# Patient Record
Sex: Female | Born: 1970 | Race: White | Hispanic: No | Marital: Married | Smoking: Never smoker
Health system: Southern US, Community
[De-identification: ages and names within clinical notes are randomized; demographics above are authoritative.]

## PROBLEM LIST (undated history)

## (undated) DIAGNOSIS — S82899A Other fracture of unspecified lower leg, initial encounter for closed fracture: Secondary | ICD-10-CM

## (undated) DIAGNOSIS — N200 Calculus of kidney: Secondary | ICD-10-CM

## (undated) DIAGNOSIS — T7840XA Allergy, unspecified, initial encounter: Secondary | ICD-10-CM

## (undated) DIAGNOSIS — N189 Chronic kidney disease, unspecified: Secondary | ICD-10-CM

## (undated) DIAGNOSIS — K449 Diaphragmatic hernia without obstruction or gangrene: Secondary | ICD-10-CM

## (undated) DIAGNOSIS — K589 Irritable bowel syndrome without diarrhea: Secondary | ICD-10-CM

## (undated) DIAGNOSIS — M1990 Unspecified osteoarthritis, unspecified site: Secondary | ICD-10-CM

## (undated) HISTORY — PX: BACK SURGERY: SHX140

## (undated) HISTORY — PX: WISDOM TOOTH EXTRACTION: SHX21

## (undated) HISTORY — DX: Chronic kidney disease, unspecified: N18.9

## (undated) HISTORY — DX: Other fracture of unspecified lower leg, initial encounter for closed fracture: S82.899A

## (undated) HISTORY — DX: Unspecified osteoarthritis, unspecified site: M19.90

## (undated) HISTORY — DX: Allergy, unspecified, initial encounter: T78.40XA

## (undated) HISTORY — DX: Calculus of kidney: N20.0

## (undated) HISTORY — DX: Diaphragmatic hernia without obstruction or gangrene: K44.9

## (undated) HISTORY — DX: Irritable bowel syndrome, unspecified: K58.9

---

## 1998-08-16 ENCOUNTER — Other Ambulatory Visit: Admission: RE | Admit: 1998-08-16 | Discharge: 1998-08-16 | Payer: Self-pay | Admitting: Obstetrics & Gynecology

## 1999-05-15 HISTORY — PX: CYSTOSCOPY/RETROGRADE/URETEROSCOPY/STONE EXTRACTION WITH BASKET: SHX5317

## 1999-05-29 ENCOUNTER — Ambulatory Visit (HOSPITAL_COMMUNITY): Admission: RE | Admit: 1999-05-29 | Discharge: 1999-05-29 | Payer: Self-pay | Admitting: Urology

## 1999-05-29 ENCOUNTER — Encounter: Payer: Self-pay | Admitting: Urology

## 1999-08-10 ENCOUNTER — Other Ambulatory Visit: Admission: RE | Admit: 1999-08-10 | Discharge: 1999-08-10 | Payer: Self-pay | Admitting: Obstetrics & Gynecology

## 2000-03-12 ENCOUNTER — Inpatient Hospital Stay (HOSPITAL_COMMUNITY): Admission: AD | Admit: 2000-03-12 | Discharge: 2000-03-12 | Payer: Self-pay | Admitting: Obstetrics & Gynecology

## 2000-03-14 ENCOUNTER — Encounter: Payer: Self-pay | Admitting: Obstetrics & Gynecology

## 2000-03-14 ENCOUNTER — Inpatient Hospital Stay (HOSPITAL_COMMUNITY): Admission: AD | Admit: 2000-03-14 | Discharge: 2000-03-14 | Payer: Self-pay | Admitting: Obstetrics & Gynecology

## 2000-03-15 ENCOUNTER — Inpatient Hospital Stay (HOSPITAL_COMMUNITY): Admission: AD | Admit: 2000-03-15 | Discharge: 2000-03-18 | Payer: Self-pay | Admitting: Obstetrics and Gynecology

## 2000-03-21 ENCOUNTER — Encounter: Admission: RE | Admit: 2000-03-21 | Discharge: 2000-06-19 | Payer: Self-pay | Admitting: Obstetrics & Gynecology

## 2000-04-11 ENCOUNTER — Other Ambulatory Visit: Admission: RE | Admit: 2000-04-11 | Discharge: 2000-04-11 | Payer: Self-pay | Admitting: Obstetrics & Gynecology

## 2001-07-07 ENCOUNTER — Other Ambulatory Visit: Admission: RE | Admit: 2001-07-07 | Discharge: 2001-07-07 | Payer: Self-pay | Admitting: Obstetrics & Gynecology

## 2002-07-03 ENCOUNTER — Inpatient Hospital Stay (HOSPITAL_COMMUNITY): Admission: AD | Admit: 2002-07-03 | Discharge: 2002-07-06 | Payer: Self-pay | Admitting: Obstetrics & Gynecology

## 2002-08-03 ENCOUNTER — Other Ambulatory Visit: Admission: RE | Admit: 2002-08-03 | Discharge: 2002-08-03 | Payer: Self-pay | Admitting: Obstetrics & Gynecology

## 2004-06-26 ENCOUNTER — Ambulatory Visit: Payer: Self-pay | Admitting: Family Medicine

## 2004-06-28 ENCOUNTER — Ambulatory Visit: Payer: Self-pay | Admitting: Family Medicine

## 2004-07-05 ENCOUNTER — Ambulatory Visit: Payer: Self-pay

## 2004-07-26 ENCOUNTER — Ambulatory Visit: Payer: Self-pay | Admitting: Family Medicine

## 2004-08-17 ENCOUNTER — Ambulatory Visit: Payer: Self-pay | Admitting: Gastroenterology

## 2004-08-24 ENCOUNTER — Ambulatory Visit: Payer: Self-pay | Admitting: Gastroenterology

## 2004-09-20 ENCOUNTER — Ambulatory Visit: Payer: Self-pay | Admitting: Gastroenterology

## 2004-10-26 ENCOUNTER — Ambulatory Visit: Payer: Self-pay | Admitting: Gastroenterology

## 2005-01-03 ENCOUNTER — Ambulatory Visit: Payer: Self-pay | Admitting: Family Medicine

## 2005-06-15 ENCOUNTER — Ambulatory Visit: Payer: Self-pay | Admitting: Family Medicine

## 2005-06-22 ENCOUNTER — Ambulatory Visit: Payer: Self-pay | Admitting: Gastroenterology

## 2005-06-27 ENCOUNTER — Ambulatory Visit: Payer: Self-pay | Admitting: Gastroenterology

## 2005-07-27 ENCOUNTER — Ambulatory Visit: Payer: Self-pay | Admitting: Gastroenterology

## 2005-08-31 ENCOUNTER — Encounter: Admission: RE | Admit: 2005-08-31 | Discharge: 2005-08-31 | Payer: Self-pay | Admitting: Gastroenterology

## 2005-09-21 ENCOUNTER — Ambulatory Visit: Payer: Self-pay | Admitting: Gastroenterology

## 2005-10-17 ENCOUNTER — Ambulatory Visit: Payer: Self-pay | Admitting: Gastroenterology

## 2005-10-22 ENCOUNTER — Ambulatory Visit: Payer: Self-pay | Admitting: Gastroenterology

## 2006-02-06 ENCOUNTER — Other Ambulatory Visit: Admission: RE | Admit: 2006-02-06 | Discharge: 2006-02-06 | Payer: Self-pay | Admitting: Obstetrics and Gynecology

## 2006-06-22 ENCOUNTER — Ambulatory Visit: Payer: Self-pay | Admitting: Family Medicine

## 2006-07-05 ENCOUNTER — Ambulatory Visit: Payer: Self-pay | Admitting: Family Medicine

## 2007-02-18 ENCOUNTER — Other Ambulatory Visit: Admission: RE | Admit: 2007-02-18 | Discharge: 2007-02-18 | Payer: Self-pay | Admitting: Obstetrics & Gynecology

## 2007-05-05 ENCOUNTER — Ambulatory Visit: Payer: Self-pay | Admitting: Internal Medicine

## 2008-01-13 LAB — CONVERTED CEMR LAB: Pap Smear: NORMAL

## 2008-02-05 ENCOUNTER — Ambulatory Visit: Payer: Self-pay | Admitting: *Deleted

## 2008-02-05 DIAGNOSIS — Z87442 Personal history of urinary calculi: Secondary | ICD-10-CM | POA: Insufficient documentation

## 2008-02-05 DIAGNOSIS — M1990 Unspecified osteoarthritis, unspecified site: Secondary | ICD-10-CM | POA: Insufficient documentation

## 2008-02-11 ENCOUNTER — Ambulatory Visit: Payer: Self-pay | Admitting: Internal Medicine

## 2008-02-24 ENCOUNTER — Other Ambulatory Visit: Admission: RE | Admit: 2008-02-24 | Discharge: 2008-02-24 | Payer: Self-pay | Admitting: Obstetrics and Gynecology

## 2010-06-15 NOTE — Letter (Signed)
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 [address]   [place], [place] [address]   Phone: [phone]  Fax: [phone]    February 05, 2008   Employee:  KENDEL PESNELL    To Whom It May Concern:   For Medical reasons, please excuse the above named employee from work for the following dates:  Start:    End:    If you need additional information, please feel free to contact our office.         Sincerely,    Darra Lis RMA

## 2010-06-15 NOTE — Letter (Signed)
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 [address]   [place], [place] [address]   Phone: [phone]  Fax: [phone]    February 05, 2008   Employee:  KATRENA STEHLIN    To Whom It May Concern:   For Medical reasons, please excuse the above named employee from work for the following dates:  Start:02/06/08    End:02/06/08    If you need additional information, please feel free to contact our office at [phone].         Sincerely,        Darra Lis RMA

## 2010-06-15 NOTE — Assessment & Plan Note (Signed)
Summary: cough--acute only--tl   Vital Signs:  Patient Profile:   40 Years Old Female Height:     62 inches Weight:      192.2 pounds Temp:     98.1 degrees F oral BP sitting:   122 / 78  (right arm)  Vitals Entered By: Doristine Devoid (February 11, 2008 1:57 PM)                 PCP:  [facility]  Chief Complaint:  dry cough since last week and feels chest congestion but nothing coming up .  History of Present Illness: since last OV-- fever gone, aches resolved chief complaint  today is cough, OTCs no help unable to sleep    Current Allergies: ! SULFA  Past Medical History:    Reviewed history from 02/05/2008 and no changes required:       Nephrolithiasis, hx of       Osteoarthritis     Review of Systems  ENT      Complains of postnasal drainage.  Resp      Denies shortness of breath, sputum productive, and wheezing.   Physical Exam  General:     alert and well-developed.   Ears:     R ear normal and L ear normal.   Nose:     slightly  congested Mouth:     pharynx pink and moist, no erythema, and no exudates.   Lungs:     normal respiratory effort, no intercostal retractions, and no accessory muscle use.  Few ronchi w/  cough    Impression & Recommendations:  Problem # 1:  BRONCHITIS-ACUTE (ICD-466.0)  Her updated medication list for this problem includes:    Zithromax Z-pak 250 Mg Tabs (Azithromycin) .Marland Kitchen... As directed    Guaifenesin-codeine 100-10 Mg/2ml Soln (Guaifenesin-codeine) .Marland Kitchen... 1 or 2 tsp by mouth three times a day as needed cough   Complete Medication List: 1)  Birth Control Pill  2)  Zyrtec Allergy 10 Mg Tabs (Cetirizine hcl) .Marland Kitchen.. 1 by mouth qd 3)  Mucinex  .... Prn 4)  Ibuprofen  .... Prn 5)  Paxil 20 Mg Tabs (Paroxetine hcl) .... Take 1 tablet by mouth once a day 6)  Zithromax Z-pak 250 Mg Tabs (Azithromycin) .... As directed 7)  Guaifenesin-codeine 100-10 Mg/31ml Soln (Guaifenesin-codeine) .Marland Kitchen.. 1 or 2 tsp by mouth three  times a day as needed cough   Patient Instructions: 1)  fluids-rest -tylenol 2)  cough: mucinex 3)  if cough severe, codeine as needed; will cause drowsiness 4)  call if no better in few days   Prescriptions: GUAIFENESIN-CODEINE 100-10 MG/5ML SOLN (GUAIFENESIN-CODEINE) 1 or 2 tsp by mouth three times a day as needed cough  #250cc x 0   Entered and Authorized by:   Nolon Rod. Nic Lampe MD   Signed by:   Nolon Rod. Khyron Garno MD on 02/11/2008   Method used:   Print then Give to Patient   RxID:   [id number] ZITHROMAX Z-PAK 250 MG TABS (AZITHROMYCIN) as directed  #1 x 0   Entered and Authorized by:   Nolon Rod. Foch Rosenwald MD   Signed by:   Nolon Rod. Alonnah Lampkins MD on 02/11/2008   Method used:   Electronically to        [facility] (retail)       [address]       [place], [place]  [address]       Ph:  [phone]       Fax: [phone]   RxID:   [id number]  ]

## 2010-06-15 NOTE — Assessment & Plan Note (Signed)
Summary: ACUTE ONLY;SCRATCHY THROAT AND TENDER IN SINUS AREA//ALJ   Vital Signs:  Patient Profile:   40 Years Old Female Weight:      196.38 pounds Temp:     97.1 degrees F oral Pulse rate:   68 / minute Pulse rhythm:   regular Resp:     15 per minute BP sitting:   118 / 80  (left arm) Cuff size:   large  Pt. in pain?   no  Vitals Entered By: Wendall Stade (May 05, 2007 4:33 PM)                  Chief Complaint:  scratchy throat for one week, sinus pain and pressure for one month, and URI symptoms.  History of Present Illness: sinus pain and pressure tender to touch for one month, scratchy throat for one week  Clammy for last two to three nights   URI Symptoms; Rx: Mucinex, Zyrtec      This is a 40 year old woman who presents with URI symptoms.  Cough dry @ night.  The patient reports nasal congestion, clear nasal discharge, sore throat, dry cough, and sick contacts, but denies purulent nasal discharge, productive cough, and earache.  Associated symptoms include dyspnea, use of an antipyretic, and response to antipyretic.  The patient denies fever, stiff neck, wheezing, rash, vomiting, and diarrhea.  The patient also reports headache and severe fatigue.  The patient denies itchy watery eyes, itchy throat, sneezing, seasonal symptoms, response to antihistamine, and muscle aches.  Risk factors for Strep sinusitis include double sickening.  The patient denies the following risk factors for Strep sinusitis: poor response to decongestant, tooth pain, Strep exposure, and tender adenopathy.    Current Allergies (reviewed today): ! SULFA     Review of Systems  ENT      Complains of postnasal drainage.      Bilat facial pain; no purulence   Physical Exam  General:     Well-developed,well-nourished,in no acute distress; alert,appropriate and cooperative throughout examination Eyes:     No corneal or conjunctival inflammation noted. EOMI. Perrla. Vision grossly normal.  Ears:     External ear exam shows no significant lesions or deformities.  Otoscopic examination reveals clear canals, tympanic membranes are intact bilaterally without bulging, retraction, inflammation or discharge. Hearing is grossly normal bilaterally. Nose:     External nasal examination shows no deformity or inflammation. Nasal mucosa are pink and moist without lesions or exudates. Mouth:     Mild ereythema Neck:     No deformities, masses, or tenderness noted. Lungs:     Normal respiratory effort, chest expands symmetrically. Lungs are clear to auscultation, no crackles or wheezes. Cervical Nodes:     No lymphadenopathy noted Axillary Nodes:     No palpable lymphadenopathy    Impression & Recommendations:  Problem # 1:  URI (ICD-465.9)  Her updated medication list for this problem includes:    Zyrtec Allergy 10 Mg Tabs (Cetirizine hcl) .Marland Kitchen... 1 by mouth qd    Promethazine-codeine 6.25-10 Mg/33ml Syrp (Promethazine-codeine) .Marland Kitchen... 1 tsp q 4 hrs as needed   Complete Medication List: 1)  Birth Control Pill  2)  Zyrtec Allergy 10 Mg Tabs (Cetirizine hcl) .Marland Kitchen.. 1 by mouth qd 3)  Mucinex  .... Prn 4)  Ibuprofen  .... Prn 5)  Clarithromycin 500 Mg Tb24 (Clarithromycin) .... 2 once daily with a meal 6)  Promethazine-codeine 6.25-10 Mg/55ml Syrp (Promethazine-codeine) .Marland Kitchen.. 1 tsp q 4 hrs as needed  Patient Instructions: 1)  Drink as much fluid as you can tolerate for the next few days. Neti pot once daily as needed for nasal congestion.    Prescriptions: PROMETHAZINE-CODEINE 6.25-10 MG/5ML  SYRP (PROMETHAZINE-CODEINE) 1 tsp q 4 hrs as needed  #240cc x 0   Entered and Authorized by:   Marga Melnick MD   Signed by:   Marga Melnick MD on 05/05/2007   Method used:   Print then Give to Patient   RxID:   [id number] CLARITHROMYCIN 500 MG  TB24 (CLARITHROMYCIN) 2 once daily with a meal  #20 x 0   Entered and Authorized by:   Marga Melnick MD   Signed by:   Marga Melnick MD  on 05/05/2007   Method used:   Print then Give to Patient   RxID:   [id number]  ]

## 2010-06-15 NOTE — Assessment & Plan Note (Signed)
Summary: FEELING SICK   Vital Signs:  Patient Profile:   40 Years Old Female Height:     62 inches Weight:      195 pounds BMI:     35.79 O2 treatment:    Room Air Temp:     100.0 degrees F oral Pulse rate:   100 / minute Pulse rhythm:   regular Resp:     16 per minute BP sitting:   134 / 80  (right arm) Cuff size:   regular  Vitals Entered By: Darra Lis RMA (February 05, 2008 10:44 AM)                 Visit Type:  acute PCP:  [facility]  Chief Complaint:  body aches, fever, and scratchy throat - URI symptoms.  History of Present Illness: Patient is usually seen at another [facility].  They are here for an acute visit only.  Patient had a scratchy throat and PND for 2 days, but she attributed it to seasonal allergies. Then last night, the patient started with sore throat and achy and feverish, but no temp taken.  The patient reports nasal congestion, yellow nasal discharge, sore throat, productive cough, and sick contacts, but denies earache.  Associated symptoms include fever - to touch, use of an antipyretic, and some response to antipyretic.  The patient denies stiff neck, dyspnea, wheezing, rash, vomiting, and diarrhea.  The patient also reports headache, muscle aches, and some fatigue.  The patient denies any significant itchy watery eyes, itchy throat, sneezing.  The patient denies unilateral facial pain, unilateral nasal discharge, double sickening, tooth pain, Strep exposure, and tender adenopathy.      Updated Prior Medication List: * BIRTH CONTROL PILL  ZYRTEC ALLERGY 10 MG  TABS (CETIRIZINE HCL) 1 by mouth qd * MUCINEX prn * IBUPROFEN prn PAXIL 20 MG TABS (PAROXETINE HCL) Take 1 tablet by mouth once a day  Current Allergies (reviewed today): ! SULFA  Past Medical History:    Nephrolithiasis, hx of    Osteoarthritis   Family History:    Family History Hypertension  Social History:    Occupation: Administrator   Risk Factors:   Tobacco use:  never Passive smoke exposure:  no Drug use:  no HIV high-risk behavior:  yes Alcohol use:  yes Exercise:  no Seatbelt use:  100 %  Family History Risk Factors:    Family History of MI in females < 39 years old:  no    Family History of MI in males < 64 years old:  no  PAP Smear History:     Date of Last PAP Smear:  01/13/2008    Results:  Normal   Mammogram History:     Date of Last Mammogram:  09/09/2004    Results:  Normal Bilateral    Review of Systems       see HPI   Physical Exam  General:     Well-developed,well-nourished, well-hydrated, in no acute distress, but tired/ill appearing, non-toxic Eyes:     No conjunctival inflammation or injection noted. EOMI. PERRLA. Vision grossly normal. Ears:     External ear exam shows no significant lesions or deformities.  Otoscopic examination reveals clear canals, tympanic membranes are intact bilaterally without bulging, retraction, inflammation or discharge. Hearing is grossly normal bilaterally. Nose:     External nasal examination shows no deformity or inflammation. Nasal mucosa are pink and moist without lesions or exudates. Mouth:     Oral mucosa and  oropharynx without lesions or exudates.  some postnasal drip and minimal posterior pharynx erythema Neck:     supple, full ROM, no LAN.   Lungs:     Normal respiratory effort, chest expands symmetrically. Lungs are clear to auscultation, no crackles, rales  or wheezes. normal breath sounds and good air flow.   Heart:     Normal rate and regular rhythm. S1 and S2 normal without gallop, murmur, click, rub or other extra sounds. Abdomen:     Bowel sounds positive,abdomen soft and non-tender without masses, organomegaly, .no distention. no obvious hernia noted.   Msk:     No gross deformity noted of cervical,  thoracic or lumbar spine.  normal ROM and no muscle atrophy noted.   Extremities:     No clubbing, cyanosis, edema, or deformity noted with grossly  normal full range of motion of all joints.   Neurologic:     alert & oriented X3 and gait normal.  no deficit in strength noted, no balance problems noted, neuro is grossly intact Skin:     Intact without suspicious lesions or rashes Psych:     Cognition and judgment appear intact. Alert and cooperative, normally interactive     Impression & Recommendations:  Problem # 1:  URI (ICD-465.9) Get plenty of rest, drink lots of clear liquids, and use Tylenol or Ibuprofen for fever and comfort.  Use warm moist compresses if needed on sinuses, and over the counter cold/flu medications ( only as directed). Call if no improvement in 5-7 days, sooner if increasing pain, fever, or new symptoms.    Complete Medication List: 1)  Birth Control Pill  2)  Zyrtec Allergy 10 Mg Tabs (Cetirizine hcl) .Marland Kitchen.. 1 by mouth qd 3)  Mucinex  .... Prn 4)  Ibuprofen  .... Prn 5)  Paxil 20 Mg Tabs (Paroxetine hcl) .... Take 1 tablet by mouth once a day   Patient Instructions: 1)  Get plenty of rest, drink lots of clear liquids, and use Tylenol or Ibuprofen for fever and comfort.  Use warm moist compresses if needed on sinuses, and over the counter cold/flu medications ( only as directed). Call if no improvement in 5-7 days, sooner if increasing pain, fever, or new symptoms.   ]

## 2010-09-29 NOTE — Op Note (Signed)
[facility]  Patient:    Dorothy Armstrong, Dorothy Armstrong                       MRN: [id number] Proc. Date: 03/15/00 Adm. Date:  [id number] Disc. Date: [id number] Attending:  Genia Del                           Operative Report  PREOPERATIVE DIAGNOSES:       Breech presentation, early labor, term gestation.  PROCEDURE:                    Primary cesarean section.  POSTOPERATIVE DIAGNOSES:      Homero Fellers breech presentation, early labor, term gestation.  ANESTHESIA:                   Spinal.  SURGEON:                      Sheronette A. Cherly Hensen, M.D.  INDICATIONS:                  This is a 40 year old gravida 1, para 0 female at term with a known breech presentation who was scheduled for a primary cesarean section on March 16, 2000 but who presented with complaints of regular contractions x 1 hour and a finding of a cervix that was 4-5 cm dilated with a confirmed breech presentation by ultrasound.  Patient is now being taken to the operating room for a primary cesarean section.  She had had an ultrasound to evaluate for possible external cephalic version, but had oligohydramnios which negated the ability to perform the version.  Risks and benefits of the procedure were reviewed with the patient and her husband. Consent was signed.  The patient was transferred to the operating room.  PROCEDURE:                    Under adequate spinal anesthesia the patient was placed in the supine position with a left lateral tilt.  She was sterilely prepped and draped in the usual fashion and an indwelling Foley catheter was placed.  A Pfannenstiel skin incision was made, carried down to the rectus fascia.  Rectus fascia was incised in the midline, extended bilaterally.  The rectus fascia was then bluntly and with sharp dissection dissected off the rectus muscle in a superior and inferior fashion.  The rectus muscle was split in the midline using sharp dissection.  The  parietal peritoneum was entered bluntly and extended superiorly and inferiorly.  The vesicouterine peritoneum was opened.  The bladder was then bluntly dissected off the lower uterine segment and displaced from the operative field using a bladder retractor.  A curvilinear low transverse uterine incision was then made and extended bilaterally using bandage scissors.  The fetus was found to be in a frank breech position.  Initial delivery was that of the lower extremity and resultant delivery using the usual breech maneuver was then accomplished.  A live female infant was the result of the delivery.  The infant was bulb suctioned and the abdomen cord was clamped, cut.  The baby was transferred to the awaiting pediatricians who subsequently assigned Apgars of 8 and 9 at one and five minutes.  The weight of the baby was 8 pounds 2 ounces.  The placenta was spontaneous, intact.  Uterine cavity was explored.  No intracavitary lesion was noted.  The  uterine cavity was cleaned of debris.  Normal tubes and ovaries are noted bilaterally.  The uterine incision was closed in two layers. The first layer was a running locked stitch of 0 Monocryl.  Second layer was embrocating using 0 Monocryl suture.  The uterus was returned to the abdomen. The abdomen was irrigated.  Paracolic gutters were cleaned of debris. Specimen labeled "placenta" was now sent to pathology.  The vesicouterine peritoneum and parietal peritoneum were now closed.  Inspection of the uterine incision showed good hemostasis.  The rectus fascia was closed with 0 Vicryl x 2.  The subcutaneous area was irrigated, small bleeders cauterized.  The subcuticular areas injected with 1/4% Marcaine.  The skin was approximated using Ethicon staples.  Estimated blood loss was 700 cc.  Intraoperative fluid was 1900 cc Crystalloid.  Urine output was 400 cc clear yellow urine. Complications were none.  Sponge and instrument counts x 2 were correct.   The patient tolerated the procedure well and was transferred to the recovery room in stable condition. DD:  03/15/00 TD:  03/17/00 Job: [id number]

## 2010-09-29 NOTE — Discharge Summary (Signed)
   NAMEANDRE, Dorothy Armstrong                          ACCOUNT NO.:  [id number]   MEDICAL RECORD NO.:  [id number]                   PATIENT TYPE:  INP   LOCATION:  9116                                 FACILITY:  [facility]   PHYSICIAN:  Genia Del, M.D.             DATE OF BIRTH:  07-16-1970   DATE OF ADMISSION:  07/03/2002  DATE OF DISCHARGE:  07/06/2002                                 DISCHARGE SUMMARY   ADMISSION DIAGNOSES:  1. 39+ weeks gestation.  2. Previous Cesarean section.  3. Declined V-back.   DISCHARGE DIAGNOSES:  1. 39+ weeks gestation.  2. Previous Cesarean section.  3. Declined V-back.  4. Birth of a baby girl on July 03, 2002 by Cesarean section.   INTERVENTION:  On July 03, 2002 repeat low transverse Cesarean section  under spinal anesthesia.   HOSPITAL COURSE:  The patient was admitted on July 03, 2002. She is a  Gravida II, Para I at 39+ weeks gestation. She had a Cesarean section for  breech presentation in November of 2001 and declined a V-back. Her pregnancy  course was normal. Group B strep was negative. Repeat low transverse  Cesarean section was done under spinal anesthesia. She had a baby girl at  8:07 a.m. There was one nuchal cord. Apgar's were 8 and 9. Placenta was  spontaneous and complete. Hysterectomy was closed in two planes. Estimated  blood loss was 600 cc's and no complications occurred. The postoperative  evolution was uneventful. She remained afebrile and hemodynamically stable.  Her hemoglobin on postoperative day one was 10.6 with a hematocrit of 30.3.   DISPOSITION:  She was discharged home on postoperative day three in stable  status.   DISCHARGE MEDICATIONS:  Postoperative advise was given. She was prescribed  Motrin and Tylox as needed as well as Chromogen.   FOLLOW UP:  She will follow-up for postoperative evaluation at [facility]  in four weeks.                                               Genia Del,  M.D.    ML/MEDQ  D:  07/24/2002  T:  07/25/2002  Job:  [id number]

## 2010-09-29 NOTE — Op Note (Signed)
NAMECHRISTEAN, Dorothy Armstrong                          ACCOUNT NO.:  [id number]   MEDICAL RECORD NO.:  [id number]                   PATIENT TYPE:  INP   LOCATION:  9198                                 FACILITY:  [facility]   PHYSICIAN:  Genia Del, M.D.             DATE OF BIRTH:  07/24/1970   DATE OF PROCEDURE:  07/03/2002  DATE OF DISCHARGE:                                 OPERATIVE REPORT   PREOPERATIVE DIAGNOSES:  1. A 39+ week gestation.  2. Previous cesarean section.  3. Declined vaginal birth after cesarean.   POSTOPERATIVE DIAGNOSES:  1. A 39+ week gestation.  2. Previous cesarean section.  3. Declined vaginal birth after cesarean.   PROCEDURE:  Repeat low transverse cesarean section.   SURGEON:  Genia Del, M.D.   ASSISTANT:  Gerri Spore B. Earlene Plater, M.D.   ANESTHESIOLOGIST:  Quillian Quince, M.D.   PROCEDURE:  Under spinal anesthesia the patient is in 15 degree left  decubitus position.  She is prepped with Hibiclens in the abdominal,  suprapubic, vulvar, and vaginal areas.  The bladder catheter is inserted and  the patient is draped as usual.  We infiltrate the subcutaneous tissue with  Marcaine 0.25 plain 10 mL at the site of the previous scar.  We then make a  Pfannenstiel incision at that level with the scalpel.  We open the adipose  tissue with electrocautery.  We then open the aponeurosis transversely with  the Mayo scissors.  The aponeurosis is separated from the recti muscles on  the midline.  We then open the parietoperitoneum longitudinally with the  Metzenbaum scissors.  The lower uterine segment is opened transversely at  the level of the visceroperitoneum with the Metzenbaum scissors and the  bladder is declined downward.  A low transverse hysterotomy is done with the  scalpel and prolongation is achieved with scissors.  We rupture the  membranes.  The amniotic fluid is clear.  The baby is in cephalic  presentation.  Birth of a baby girl at 8:07 a.m.  One  loose nuchal cord is  present.  The baby is suctioned with a bulb after delivering the head.  The  cord is clamped and cut.  The baby is given to the neonatal team.  Apgars  are 8 and 9.  The cord blood is taken.  The placenta is evacuated  spontaneously.  It is complete with three vessels.  We then do a uterine  revision.  The uterus contracts well with IV Pitocin.  We close the  hysterotomy after exteriorizing the uterus with a Vicryl 0 in a running  locked suture.  A second plain in a mattress fashion is done with Vicryl 0.  One X stitch is applied on the right angle to complete hemostasis.  The  tubes and ovaries are normal bilaterally.  The uterus is put back in place.  The hysterotomy is verified.  Hemostasis is adequate.  We irrigate and  suction the abdominopelvic cavity.  We verify hemostasis at the level of the  recti muscles and it is completed with electrocautery.  We close the  aponeurosis with two half running sutures of Vicryl 0.  We then verify  hemostasis at  the adipose tissue.  It is completed with electrocautery.  Count of  instruments and sponges is complete x2.  We then reapproximate the skin with  staples and a dry dressing is applied.  The estimated blood loss was 600 mL.  No complications occurred and the patient was transferred to recovery room  in good status.                                               Genia Del, M.D.    ML/MEDQ  D:  07/03/2002  T:  07/03/2002  Job:  [id number]

## 2010-09-29 NOTE — Discharge Summary (Signed)
[facility]  Patient:    Dorothy Armstrong, Dorothy Armstrong                       MRN: [id number] Adm. Date:  [id number] Disc. Date: [id number] Attending:  Maxie Better                           Discharge Summary  ADMISSION DIAGNOSES:          1.  Early labor.                               2.  Breech presentation.                               3.  Term gestation.  DISCHARGE DIAGNOSES:          1.  Frank breech presentation.                               2.  Term gestation, delivered early labor.                               3.  Iron deficiency anemia.  HISTORY OF PRESENT ILLNESS:   This is a 40 year old gravida 1, para 0 female with known breech presentation who was scheduled for a cesarean section on March 16, 2000 but presented with labor and a cervical change and still confirmed breech presentation who is now being admitted for primary cesarean section. She had intact membranes.  HOSPITAL COURSE:              The patient was admitted. She was taken to the operating room where under spinal anesthesia she underwent a primary cesarean section. This resulted in a delivery of a live female infant from the frank breech presentation. Weight of the baby was 8 pounds 2 ounces, length was 21 inches, Apgars of 8 and 9. The tubes and ovaries were noted to be normal. No intercavitary lesions were noted. The patients postoperative course was unremarkable. She was tolerating a regular diet, passing flatus, and by postop day #3 showed no evidence of an infection and was deemed well to be discharged home. Her CBC on postop day #1 showed a hematocrit of 28.7, hemoglobin of 10.0, platelets 187,000, white count of 11.2. RPR was nonreactive. The patients blood type was O positive, her rubella was nonimmune, hepatitis B surface antigen was negative. The patient received rubella vaccination prior to discharge.  DISPOSITION:                   Home.  CONDITION:                     Stable.  DISCHARGE FOLLOW-UP:          [facility] in four to six weeks.  DISCHARGE INSTRUCTIONS:       Call for temperature greater than or equal to 100.4. Nothing per vagina for four to six weeks. No heavy lifting or driving for two weeks. Call if increased incisional pain, redness, or drainage. Call if severe abdominal pain and/or soaking a regular pad every hour or more frequently.  DISCHARGE MEDICATIONS:  1.  Tylox #30 one p.o. q.6h. p.r.n. pain.                               2.  Over-the-counter iron one p.o. q.d.                               3.  Prenatal vitamins one p.o. q.d. DD:  04/07/00 TD:  04/07/00 Job: [id number]

## 2011-08-23 ENCOUNTER — Other Ambulatory Visit: Payer: Self-pay | Admitting: Dermatology

## 2011-10-09 ENCOUNTER — Other Ambulatory Visit: Payer: Self-pay | Admitting: Dermatology

## 2012-04-24 ENCOUNTER — Other Ambulatory Visit: Payer: Self-pay | Admitting: Dermatology

## 2013-06-09 ENCOUNTER — Ambulatory Visit: Payer: Self-pay | Admitting: Obstetrics & Gynecology

## 2013-06-09 ENCOUNTER — Ambulatory Visit: Payer: Self-pay | Admitting: Obstetrics and Gynecology

## 2013-06-15 ENCOUNTER — Encounter: Payer: Self-pay | Admitting: Obstetrics & Gynecology

## 2013-06-15 ENCOUNTER — Ambulatory Visit (INDEPENDENT_AMBULATORY_CARE_PROVIDER_SITE_OTHER): Payer: Managed Care, Other (non HMO) | Admitting: Obstetrics & Gynecology

## 2013-06-15 VITALS — BP 104/60 | HR 64 | Resp 16 | Ht 62.25 in | Wt 208.5 lb

## 2013-06-15 DIAGNOSIS — Z0000 Encounter for general adult medical examination without abnormal findings: Secondary | ICD-10-CM

## 2013-06-15 DIAGNOSIS — Z01419 Encounter for gynecological examination (general) (routine) without abnormal findings: Secondary | ICD-10-CM

## 2013-06-15 LAB — BASIC METABOLIC PANEL
BUN: 14 mg/dL (ref 6–23)
CO2: 26 mEq/L (ref 19–32)
Calcium: 9.1 mg/dL (ref 8.4–10.5)
Chloride: 102 mEq/L (ref 96–112)
Creat: 0.63 mg/dL (ref 0.50–1.10)
Glucose, Bld: 81 mg/dL (ref 70–99)
Potassium: 3.8 mEq/L (ref 3.5–5.3)
Sodium: 134 mEq/L — ABNORMAL LOW (ref 135–145)

## 2013-06-15 LAB — POCT URINALYSIS DIPSTICK
Bilirubin, UA: NEGATIVE
Glucose, UA: NEGATIVE
Ketones, UA: NEGATIVE
Nitrite, UA: NEGATIVE
Protein, UA: NEGATIVE
Urobilinogen, UA: NEGATIVE
pH, UA: 5

## 2013-06-15 LAB — LIPID PANEL
Cholesterol: 155 mg/dL (ref 0–200)
HDL: 49 mg/dL (ref >39)
LDL Cholesterol: 82 mg/dL (ref 0–99)
Total CHOL/HDL Ratio: 3.2 Ratio
Triglycerides: 121 mg/dL (ref <150)
VLDL: 24 mg/dL (ref 0–40)

## 2013-06-15 MED ORDER — PAROXETINE HCL 20 MG PO TABS: 20.0000 mg | ORAL_TABLET | Freq: Every day | ORAL | Status: DC

## 2013-06-15 NOTE — Progress Notes (Signed)
43 y.o. G2P2 MarriedCaucasianF here for annual exam.  Started weight watchers this year.  Really hopes to get some weight off.  Typically, cycles are between 4-6wks.  Normally cycles are 4-6 and first two to three are heavy.  Changes super plus tampons every 4-5 hours.  In January, Had two cycles.  This is the first time that occurred for her.  Was on OCPs in the past but would prefer not to be on them again.  Wants to discuss options.    Patient's last menstrual period was 05/23/2013.          Sexually active: yes  The current method of family planning is vasectomy.    Exercising: yes  walking and eliptical Smoker:  no  Health Maintenance: Pap:  06/03/12 WNL/negative HR HPV History of abnormal Pap:  Per patient-false positive MMG:  1/14 bilat screening, 11/19/12 right diag-6 month bilateral scheduled for 06/16/13 Colonoscopy:  none BMD:   none TDaP:  4/11 Screening Labs: today, Hb today: today, Urine today: WBC-trace, RBC-trace   reports that she has never smoked. She has never used smokeless tobacco. She reports that she drinks alcohol. She reports that she does not use illicit drugs.  Past Medical History  Diagnosis Date  . IBS (irritable bowel syndrome)   . Hiatal hernia   . Kidney stone   . Ankle fracture     Past Surgical History  Procedure Laterality Date  . Wisdom tooth extraction    . Cesarean section      x2  . Kidney stone surgery      Current Outpatient Prescriptions  Medication Sig Dispense Refill  . Clindamycin-Benzoyl Per, Refr, gel daily.      . Multiple Vitamins-Minerals (MULTIVITAMIN PO) Take by mouth daily.      Marland Kitchen PARoxetine (PAXIL) 20 MG tablet 20 mg daily.      Marland Kitchen tretinoin (RETIN-A) 0.025 % cream daily.       No current facility-administered medications for this visit.    Family History  Problem Relation Age of Onset  . Breast cancer Paternal Aunt   . Hypertension Father   . Hypertension Paternal Grandfather   . CVA Paternal Grandfather   . Colon  cancer Paternal Grandmother     ROS:  Pertinent items are noted in HPI.  Otherwise, a comprehensive ROS was negative.  Exam:   BP 104/60  Pulse 64  Resp 16  Ht 5' 2.25" (1.581 m)  Wt 208 lb 8 oz (94.575 kg)  BMI 37.84 kg/m2  LMP 05/23/2013  Weight change: -6lbs  Height: 5' 2.25" (158.1 cm)  Ht Readings from Last 3 Encounters:  06/15/13 5' 2.25" (1.581 m)  02/05/08 5\' 2"  (1.575 m)    General appearance: alert, cooperative and appears stated age Head: Normocephalic, without obvious abnormality, atraumatic Neck: no adenopathy, supple, symmetrical, trachea midline and thyroid normal to inspection and palpation Lungs: clear to auscultation bilaterally Breasts: normal appearance, no masses or tenderness Heart: regular rate and rhythm Abdomen: soft, non-tender; bowel sounds normal; no masses,  no organomegaly Extremities: extremities normal, atraumatic, no cyanosis or edema Skin: Skin color, texture, turgor normal. No rashes or lesions Lymph nodes: Cervical, supraclavicular, and axillary nodes normal. No abnormal inguinal nodes palpated Neurologic: Grossly normal   Pelvic: External genitalia:  no lesions              Urethra:  normal appearing urethra with no masses, tenderness or lesions  Bartholins and Skenes: normal                 Vagina: normal appearing vagina with normal color and discharge, no lesions              Cervix: no lesions              Pap taken: no Bimanual Exam:  Uterus:  normal size, contour, position, consistency, mobility, non-tender              Adnexa: normal adnexa and no mass, fullness, tenderness               Rectovaginal: Confirms               Anus:  normal sphincter tone, no lesions  A:  Well Woman with normal exam Menorrhagia Obesity, pt doing weight watchers  P:   Mammogram starting age 63 pap smear with neg HR HPV 1/14 Endometrial ablation and Mirena IUD discussed.  Information given. BMP and lipids done for health insurance  screening recommendations. return annually or prn  An After Visit Summary was printed and given to the patient.

## 2013-06-15 NOTE — Patient Instructions (Signed)

## 2013-07-08 ENCOUNTER — Encounter: Payer: Self-pay | Admitting: Obstetrics & Gynecology

## 2013-10-29 ENCOUNTER — Other Ambulatory Visit: Payer: Self-pay | Admitting: Dermatology

## 2014-03-15 ENCOUNTER — Encounter: Payer: Self-pay | Admitting: Obstetrics & Gynecology

## 2014-06-28 ENCOUNTER — Encounter: Payer: Self-pay | Admitting: Obstetrics & Gynecology

## 2014-06-28 ENCOUNTER — Ambulatory Visit (INDEPENDENT_AMBULATORY_CARE_PROVIDER_SITE_OTHER): Payer: Managed Care, Other (non HMO) | Admitting: Obstetrics & Gynecology

## 2014-06-28 VITALS — BP 112/68 | HR 76 | Resp 18 | Ht 62.5 in | Wt 215.0 lb

## 2014-06-28 DIAGNOSIS — Z124 Encounter for screening for malignant neoplasm of cervix: Secondary | ICD-10-CM

## 2014-06-28 DIAGNOSIS — Z01419 Encounter for gynecological examination (general) (routine) without abnormal findings: Secondary | ICD-10-CM

## 2014-06-28 DIAGNOSIS — Z0000 Encounter for general adult medical examination without abnormal findings: Secondary | ICD-10-CM

## 2014-06-28 DIAGNOSIS — N920 Excessive and frequent menstruation with regular cycle: Secondary | ICD-10-CM

## 2014-06-28 LAB — POCT URINALYSIS DIPSTICK
Bilirubin, UA: NEGATIVE
Glucose, UA: NEGATIVE
Ketones, UA: NEGATIVE
Leukocytes, UA: NEGATIVE
Nitrite, UA: NEGATIVE
Protein, UA: NEGATIVE
Urobilinogen, UA: NEGATIVE
pH, UA: 5

## 2014-06-28 LAB — COMPREHENSIVE METABOLIC PANEL
ALT: 13 U/L (ref 0–35)
AST: 15 U/L (ref 0–37)
Albumin: 4 g/dL (ref 3.5–5.2)
Alkaline Phosphatase: 85 U/L (ref 39–117)
BUN: 12 mg/dL (ref 6–23)
CO2: 25 mEq/L (ref 19–32)
Calcium: 9.6 mg/dL (ref 8.4–10.5)
Chloride: 103 mEq/L (ref 96–112)
Creat: 0.62 mg/dL (ref 0.50–1.10)
Glucose, Bld: 79 mg/dL (ref 70–99)
Potassium: 3.7 mEq/L (ref 3.5–5.3)
Sodium: 137 mEq/L (ref 135–145)
Total Bilirubin: 0.3 mg/dL (ref 0.2–1.2)
Total Protein: 7.1 g/dL (ref 6.0–8.3)

## 2014-06-28 LAB — LIPID PANEL
Cholesterol: 170 mg/dL (ref 0–200)
HDL: 56 mg/dL (ref >39)
LDL Cholesterol: 87 mg/dL (ref 0–99)
Total CHOL/HDL Ratio: 3 Ratio
Triglycerides: 134 mg/dL (ref <150)
VLDL: 27 mg/dL (ref 0–40)

## 2014-06-28 LAB — TSH: TSH: 1.413 u[IU]/mL (ref 0.350–4.500)

## 2014-06-28 MED ORDER — PAROXETINE HCL 20 MG PO TABS: 20.0000 mg | ORAL_TABLET | Freq: Every day | ORAL | Status: DC

## 2014-06-28 NOTE — Progress Notes (Signed)
44 y.o. G2P2 MarriedCaucasianF here for annual exam.  Doing well.  Reports cycles continue to regular.  Flow is heavier.  Both daughters are in middle school and she feels very emotional as well.  Pt is ready to consider options this year.  Flow is usually 5-6 days.  Flow can be longer than this but not usually.      Patient's last menstrual period was 06/22/2014.          Sexually active: Yes.    The current method of family planning is vasectomy.    Exercising: Yes.    Started 2 weeks ago Smoker:  no  Health Maintenance: Pap: 05/2012 Neg. HR HPV:neg History of abnormal Pap:  yes MMG: 12/2013 BIRADS3:Probably Benign  Colonoscopy:  None BMD:   None TDaP: 2010 per pt Screening Labs: here, Hb today: here, Urine today: RBC=Large (on Menses)   reports that she has never smoked. She has never used smokeless tobacco. She reports that she drinks alcohol. She reports that she does not use illicit drugs.  Past Medical History  Diagnosis Date  . IBS (irritable bowel syndrome)   . Hiatal hernia   . Kidney stone   . Ankle fracture     Past Surgical History  Procedure Laterality Date  . Wisdom tooth extraction    . Cesarean section  2001, 2004    x2  . Cystoscopy/retrograde/ureteroscopy/stone extraction with basket  2001    Current Outpatient Prescriptions  Medication Sig Dispense Refill  . Clindamycin-Benzoyl Per, Refr, gel daily.    . Multiple Vitamins-Minerals (MULTIVITAMIN PO) Take by mouth daily.    Marland Kitchen PARoxetine (PAXIL) 20 MG tablet Take 1 tablet (20 mg total) by mouth daily. 90 tablet 4  . tretinoin (RETIN-A) 0.025 % cream daily.     No current facility-administered medications for this visit.    Family History  Problem Relation Age of Onset  . Breast cancer Paternal Aunt   . Hypertension Father   . Hypertension Paternal Grandfather   . CVA Paternal Grandfather   . Colon cancer Paternal Grandmother     ROS:  Pertinent items are noted in HPI.  Otherwise, a comprehensive  ROS was negative.  Exam:   BP 112/68 mmHg  Pulse 76  Resp 18  Ht 5' 2.5" (1.588 m)  Wt 215 lb (97.523 kg)  BMI 38.67 kg/m2  LMP 06/22/2014  Weight change: +7#  Height: 5' 2.5" (158.8 cm)  Ht Readings from Last 3 Encounters:  06/28/14 5' 2.5" (1.588 m)  06/15/13 5' 2.25" (1.581 m)  02/05/08 5\' 2"  (1.575 m)    General appearance: alert, cooperative and appears stated age Head: Normocephalic, without obvious abnormality, atraumatic Neck: no adenopathy, supple, symmetrical, trachea midline and thyroid normal to inspection and palpation Lungs: clear to auscultation bilaterally Breasts: normal appearance, no masses or tenderness Heart: regular rate and rhythm Abdomen: soft, non-tender; bowel sounds normal; no masses,  no organomegaly Extremities: extremities normal, atraumatic, no cyanosis or edema Skin: Skin color, texture, turgor normal. No rashes or lesions Lymph nodes: Cervical, supraclavicular, and axillary nodes normal. No abnormal inguinal nodes palpated Neurologic: Grossly normal   Pelvic: External genitalia:  no lesions              Urethra:  normal appearing urethra with no masses, tenderness or lesions              Bartholins and Skenes: normal  Vagina: normal appearing vagina with normal color and discharge, no lesions              Cervix: no lesions              Pap taken: Yes.   Bimanual Exam:  Uterus:  normal size, contour, position, consistency, mobility, non-tender              Adnexa: normal adnexa and no mass, fullness, tenderness               Rectovaginal: Confirms               Anus:  normal sphincter tone, no lesions  Chaperone was present for exam.  A:  Well Woman with normal exam Menorrhagia  P: yearly MMG.  Had breast calcifications last year.  Knows needs follow up this month.  States she will call to schedule.   pap smear with neg HR HPV 1/14.  Pap only today. Paxil 20mg  daily.  #90/4RF CMP, TSH, Vit D, Lipids drawn  today Endometrial ablation and Mirena IUD discussed. Information given. return annually or prn

## 2014-06-29 LAB — VITAMIN D 25 HYDROXY (VIT D DEFICIENCY, FRACTURES): Vit D, 25-Hydroxy: 20 ng/mL — ABNORMAL LOW (ref 30–100)

## 2014-06-30 LAB — IPS PAP TEST WITH REFLEX TO HPV

## 2014-07-05 ENCOUNTER — Telehealth: Payer: Self-pay

## 2014-07-05 MED ORDER — VITAMIN D (ERGOCALCIFEROL) 1.25 MG (50000 UNIT) PO CAPS: 50000.0000 [IU] | ORAL_CAPSULE | ORAL | Status: DC

## 2014-07-05 NOTE — Telephone Encounter (Signed)
Lmtcb//kn 

## 2014-07-05 NOTE — Telephone Encounter (Signed)
-----   Message from Lyman Speller, MD sent at 07/02/2014  1:52 PM EST ----- Inform pap normal.  Endometrial cells on pap.  Pt has ultrasound scheduled.  CMP, lipids, TSH normal.  Vit D 20.  Needs 50K weekly x 12 weeks then 2000 IU daily after that.  Repeat 1 year.

## 2014-07-05 NOTE — Telephone Encounter (Signed)
Patient notified of all results. Aware to take vitamin d 50,000 iu's weekly x 12 weeks and will take vit d 2000 ius daily with a repeat in one year. Vitamin D 50,000 ius #12/0 rfs sent to [facility] on [address] [facility]; patient is aware.

## 2014-07-07 NOTE — Telephone Encounter (Signed)
lmtcb-? If was fasting when she had her labs drawn.//kn

## 2014-07-20 ENCOUNTER — Ambulatory Visit (INDEPENDENT_AMBULATORY_CARE_PROVIDER_SITE_OTHER): Payer: Managed Care, Other (non HMO) | Admitting: Obstetrics & Gynecology

## 2014-07-20 ENCOUNTER — Other Ambulatory Visit: Payer: Self-pay

## 2014-07-20 ENCOUNTER — Ambulatory Visit (INDEPENDENT_AMBULATORY_CARE_PROVIDER_SITE_OTHER): Payer: Managed Care, Other (non HMO)

## 2014-07-20 VITALS — BP 122/82 | Wt 213.2 lb

## 2014-07-20 DIAGNOSIS — N920 Excessive and frequent menstruation with regular cycle: Secondary | ICD-10-CM

## 2014-07-20 DIAGNOSIS — Z30430 Encounter for insertion of intrauterine contraceptive device: Secondary | ICD-10-CM

## 2014-07-20 NOTE — Progress Notes (Signed)
44 y.o. Dorothy Armstrong here for a pelvic ultrasound due to menorrhagia.  Cycles continue to get heavier, now lasting 5-6 days.  She is considering options for treatment.  We discussed this all a year ago and then again 06/28/14 at [facility].  She is leaning towards a Mirena IUD.  Patient's last menstrual period was 06/22/2014.  Sexually active:  yes  Contraception: vasectomy  FINDINGS: UTERUS: 8.8 x 5.3 x 4.3cm EMS: 6.63mm, symmetric without any abnormal doppler flow ADNEXA:   Left ovary 2.8 x 1.8 x 1.3cm.  No cysts or masses noted.   Right ovary 3.6 x 2.2 x 1.9cm.  No cysts or masses noted. CUL DE SAC:  No free fluid  Imaging reviewed.  Endometrium is relatively thin, considering where pt is in her cycle.  Do not feel endometrial biopsy is warranted.  Pt does want to proceed with IUD placement and knows to call with onset of cycle for placement that week following onset of bleeding.  All questions answered.  Assessment:  Menorrhagia, normal ultrasound  Plan: Plan Mirena IUD placement with onset of cycle.  Will have procedure pre-certed for pt to ensure coverage.  ~15 minutes spent with patient >50% of time was in face to face discussion of above.

## 2014-07-23 ENCOUNTER — Telehealth: Payer: Self-pay | Admitting: Obstetrics & Gynecology

## 2014-07-23 NOTE — Telephone Encounter (Signed)
Left message for patient to call back. Need to go over iud benefits

## 2014-07-23 NOTE — Telephone Encounter (Signed)
Returning call.

## 2014-07-23 NOTE — Telephone Encounter (Signed)
Spoke with patient. Advised of benefit quote received for Mirena insertion.  Patient agreeable. Patient is to call within the first 5 days of her cycle to schedule insertion. Patient agreeable.

## 2014-07-27 ENCOUNTER — Telehealth: Payer: Self-pay | Admitting: Obstetrics & Gynecology

## 2014-07-27 NOTE — Telephone Encounter (Signed)
Pt started cycle last night. Notifying in order to schedule iud insertion.

## 2014-07-27 NOTE — Telephone Encounter (Signed)
Spoke with patient. Patient started cycle 3/14 and would like to schedule IUD insertion with Dr.Miller. Appointment scheduled for 3/17 at 1pm with Dr.Miller (date and time per Gay Filler). Patient is agreeable to date and time. Pre procedure instructions given.  Motrin instructions given. Motrin=Advil=Ibuprofen, 800 mg one hour before appointment. Eat a meal and hydrate well before appointment.   Routing to provider for final review. Patient agreeable to disposition. Will close encounter

## 2014-07-27 NOTE — Telephone Encounter (Signed)
Left message to call [facility] at [phone]. Offer patient appointment with Dr.Miller on 3/17 at 1pm. Will need ibuprofen/motrin and cancellation policy instructions.

## 2014-07-29 ENCOUNTER — Ambulatory Visit (INDEPENDENT_AMBULATORY_CARE_PROVIDER_SITE_OTHER): Payer: Managed Care, Other (non HMO) | Admitting: Obstetrics & Gynecology

## 2014-07-29 VITALS — BP 102/60 | HR 60 | Resp 16 | Wt 214.2 lb

## 2014-07-29 DIAGNOSIS — Z30430 Encounter for insertion of intrauterine contraceptive device: Secondary | ICD-10-CM | POA: Diagnosis not present

## 2014-07-29 DIAGNOSIS — N920 Excessive and frequent menstruation with regular cycle: Secondary | ICD-10-CM

## 2014-07-29 NOTE — Progress Notes (Signed)
44 y.o. G2P2 Married Caucasian female presents for  insertion of Mirena due to menorrhagia.  She has been counseled about alternative options and has decided to proceed with this.   LMP:  Patient's last menstrual period was 07/26/2014.  Healthy female:  WNWD WF, NAD Abdomen: soft, non-tender Groin: no inguinal nodes palpated  Pelvic exam: Vulva;normal female genitalia Vagina:normal vagina Cervix:Non-tender, Negative CMT, no lesions or redness Uterus:normal shape, position and consistency   After patient read information booklet and all questions were answered, informed consent was obtained.    Procedure:  Speculum inserted into vagina. Cervix visualized and cleansed with betadine solution X 3. Paracervical block was not placed.  Tenaculum placed on cervix at 12 o'clock position.  Uterus sounded to 8.5 centimeters.  IUD removed from sterile packet and under sterile conditions inserted to fundus of uterus.  Introducer removed without difficulty.  IUD string trimmed to 3 centimeters.  (one string is slightly longer than the other string).  Remainder string given to patient to feel for identification.  Tenaculum removed.  Minimal bleeding noted.  Speculum removed.  Uterus palpated normal.  Patient tolerated procedure well.  IUD Lot #:[id number].  Exp: 6/18.  Package information attached to consent and scanned into EPIC.  A: Insertion of Mirena IUD  P: Return to office 6 weeks for recheck Pt knows IUD needs to be replaced approximately 07/29/2019.  Instructions provided.

## 2014-08-01 ENCOUNTER — Encounter: Payer: Self-pay | Admitting: Obstetrics & Gynecology

## 2014-08-01 DIAGNOSIS — N920 Excessive and frequent menstruation with regular cycle: Secondary | ICD-10-CM | POA: Insufficient documentation

## 2014-08-10 NOTE — Telephone Encounter (Signed)
Form has been filled out and faxed appropriately.//kn

## 2014-09-14 ENCOUNTER — Ambulatory Visit (INDEPENDENT_AMBULATORY_CARE_PROVIDER_SITE_OTHER): Payer: Managed Care, Other (non HMO) | Admitting: Obstetrics & Gynecology

## 2014-09-14 VITALS — BP 122/80 | HR 60 | Resp 12 | Wt 218.0 lb

## 2014-09-14 DIAGNOSIS — Z30431 Encounter for routine checking of intrauterine contraceptive device: Secondary | ICD-10-CM | POA: Diagnosis not present

## 2014-09-14 DIAGNOSIS — R0781 Pleurodynia: Secondary | ICD-10-CM

## 2014-09-14 DIAGNOSIS — R1011 Right upper quadrant pain: Secondary | ICD-10-CM

## 2014-09-14 NOTE — Progress Notes (Signed)
Patient scheduled for abdominal ultrasound at [facility] at [place] location for 09/21/14 at 0945. Patient will have chest xray done on same day in same location.

## 2014-09-14 NOTE — Progress Notes (Signed)
Subjective:     Patient ID: Dorothy Armstrong, female   DOB: [birth year], 44 y.o.   MRN: [id number]  HPI 44 yo G2P2 MWF here her for IUD recheck.  Pt has had spotting over the last several weeks.  At times she has had to wear a tampon or a pantyshield.  Then 3/18, she started bleeding more like a period but much lighter.  She hasn't had any heavy days and flow was lighter.  She also used less tampons over the last month despite the spotting.  There were no clots or tissue.  Pleased with improvement in bleeding but wants a little reassurance about the spotting.  No pelvic pain and no pain with intercourse.  Has not tried to feel her string.  Reports a new, about month long, pain that she describes is in her low right rib/almost like in her lungs.  No SOB.  No flank pain.  No hematuria.  No pain with eating.  Just can't figure it out.  Wants some recommendations.  Ok with watching if I tell her this is "normal".  Review of Systems  All other systems reviewed and are negative.      Objective:   Physical Exam  Constitutional: She is oriented to person, place, and time. She appears well-developed and well-nourished.  Pulmonary/Chest: Effort normal and breath sounds normal.  Abdominal: Soft. Bowel sounds are normal. She exhibits no distension and no mass. There is no tenderness. There is no rebound and no guarding.  Genitourinary: Uterus normal. There is no rash, tenderness or lesion on the right labia. There is no rash, tenderness or lesion on the left labia. Cervix exhibits no motion tenderness and no discharge. Right adnexum displays no mass, no tenderness and no fullness. Left adnexum displays no mass, no tenderness and no fullness. There is bleeding (dark, spotty) in the vagina.  2cm IUD string noted from cervical os.  Lymphadenopathy:       Right: No inguinal adenopathy present.       Left: No inguinal adenopathy present.  Neurological: She is alert and oriented to person, place, and time.   Psychiatric: She has a normal mood and affect.       Assessment:     S/p Mirena IUD placement 07/29/14   RUQ pain/rib pain?  Plan:     Pt reassured about her bleeding/spotting with the Mirena IUD--this is very appropriate for only having the IUD about six weeks Xray PA and Lat of chest RUQ ultrasound

## 2014-09-16 ENCOUNTER — Other Ambulatory Visit: Payer: Self-pay

## 2014-09-19 ENCOUNTER — Encounter: Payer: Self-pay | Admitting: Obstetrics & Gynecology

## 2014-09-21 ENCOUNTER — Ambulatory Visit
Admission: RE | Admit: 2014-09-21 | Discharge: 2014-09-21 | Disposition: A | Discharge status: Home / Self Care | Payer: Managed Care, Other (non HMO) | Source: Ambulatory Visit | Attending: Obstetrics & Gynecology | Admitting: Obstetrics & Gynecology

## 2014-09-21 ENCOUNTER — Other Ambulatory Visit: Payer: Self-pay | Admitting: Obstetrics & Gynecology

## 2014-09-21 DIAGNOSIS — R0781 Pleurodynia: Secondary | ICD-10-CM

## 2014-09-21 DIAGNOSIS — R1011 Right upper quadrant pain: Secondary | ICD-10-CM

## 2014-09-21 IMAGING — US US ABDOMEN COMPLETE
1 series · 13 of 25 positions shown · non-contrast
Comparison: None.

CLINICAL DATA: Right upper quadrant pain, right flank pain, history
of kidney stones

EXAM:
ULTRASOUND ABDOMEN COMPLETE

[Series 1: us abdomen complete · 0.39mm/px · 13 of 72 slices shown]
[im 1/72]
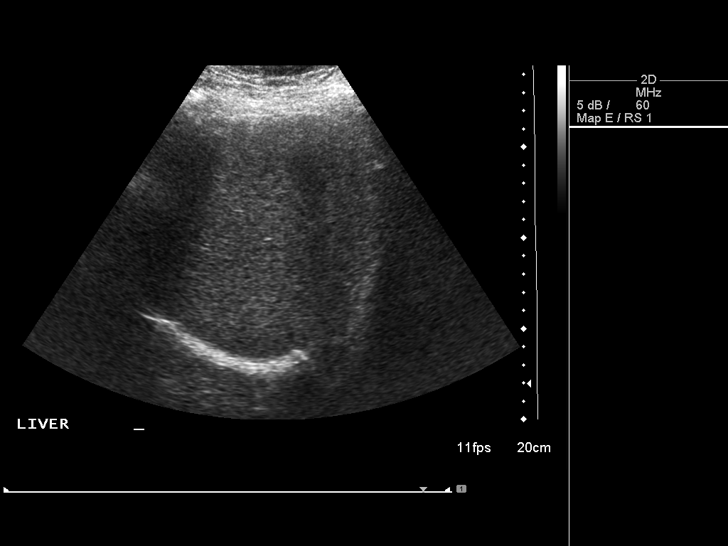
[im 6/72]
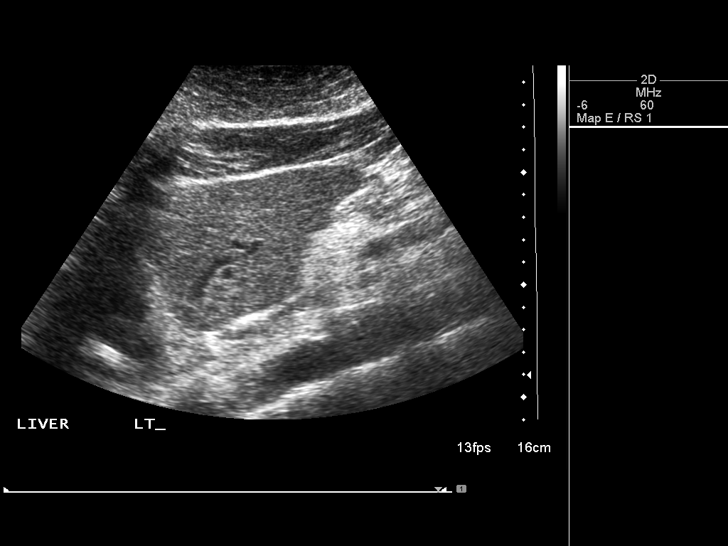
[im 12/72]
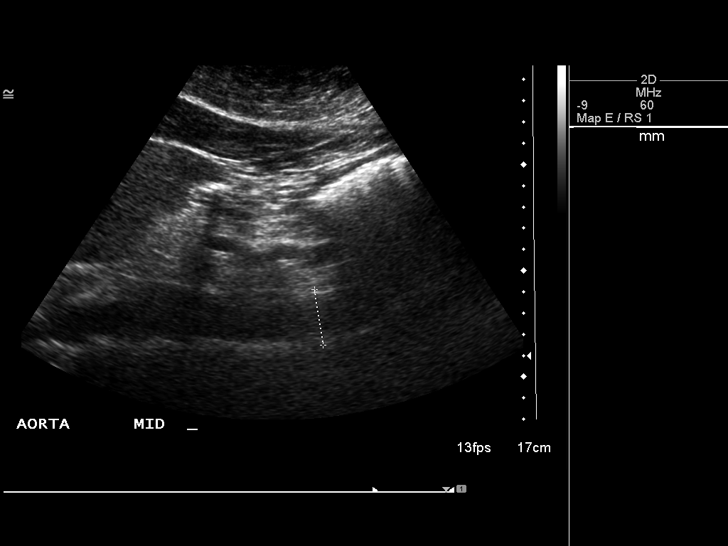
[im 18/72]
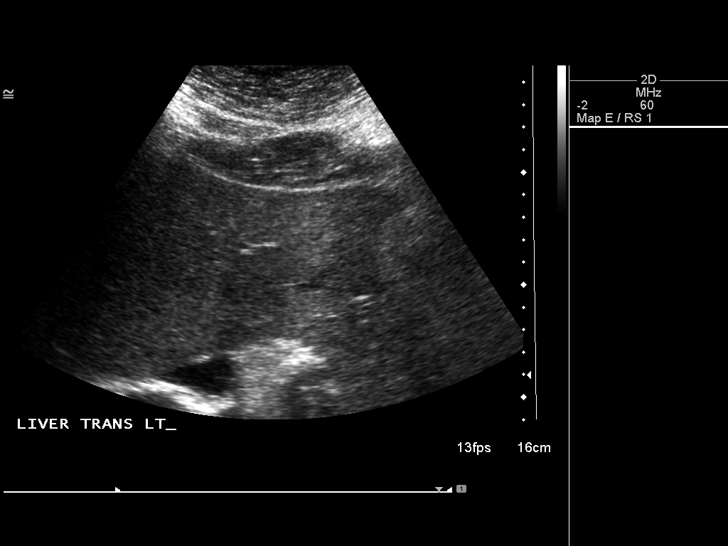
[im 24/72]
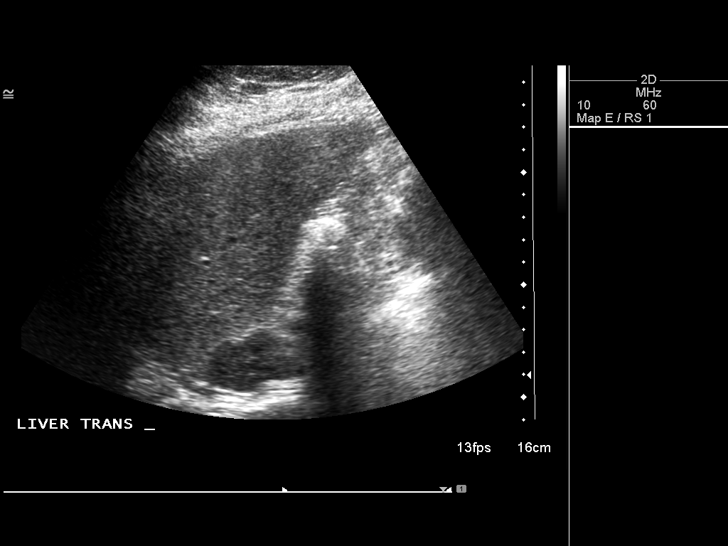
[im 30/72]
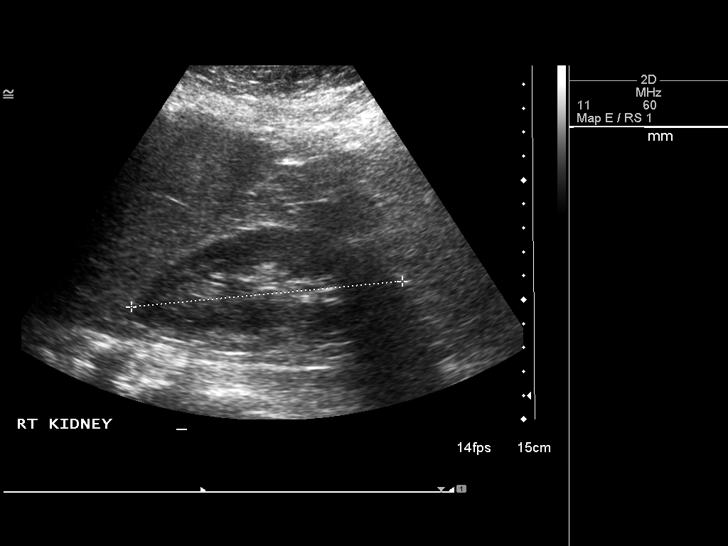
[im 36/72]
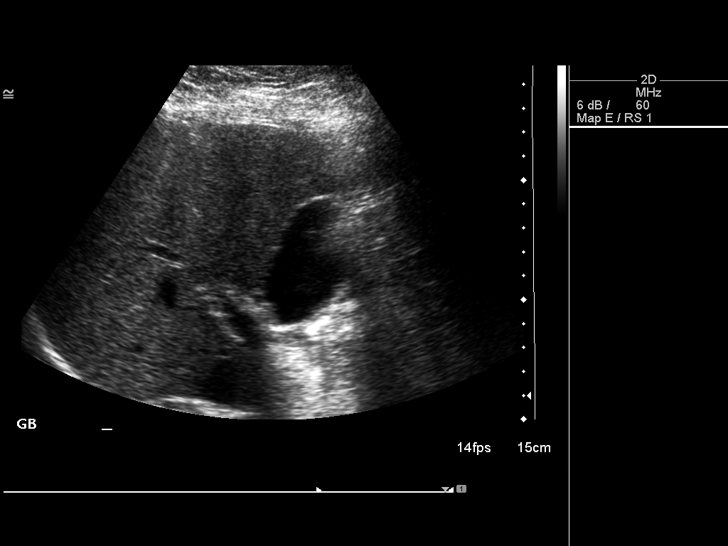
[im 42/72]
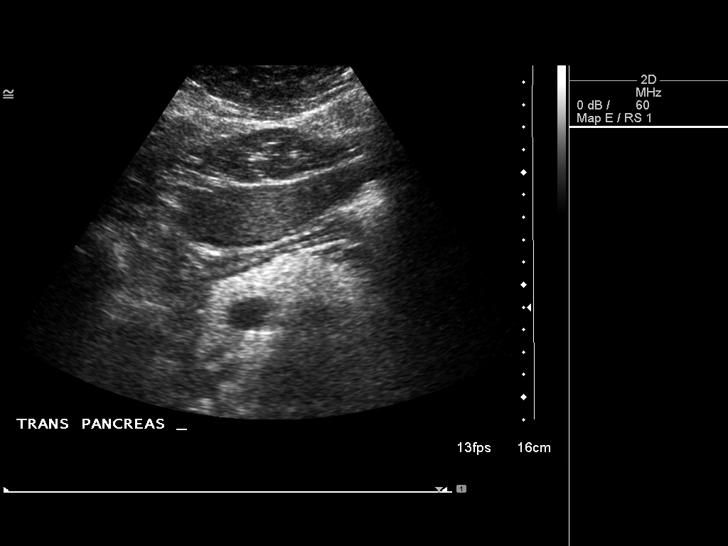
[im 48/72]
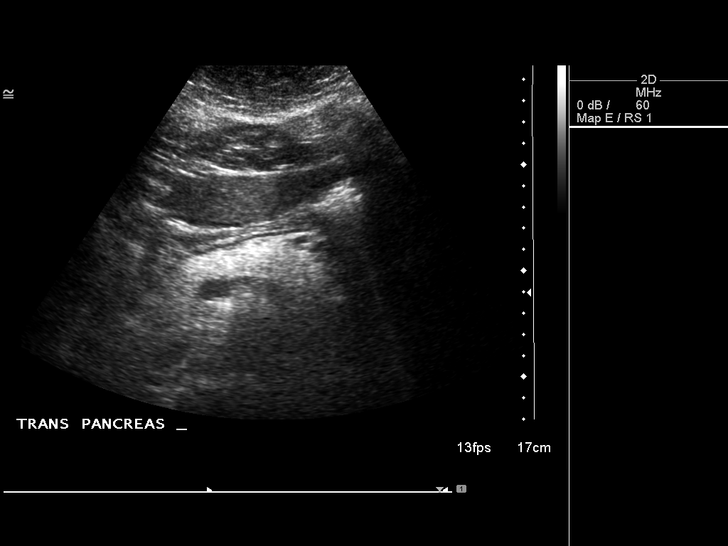
[im 54/72]
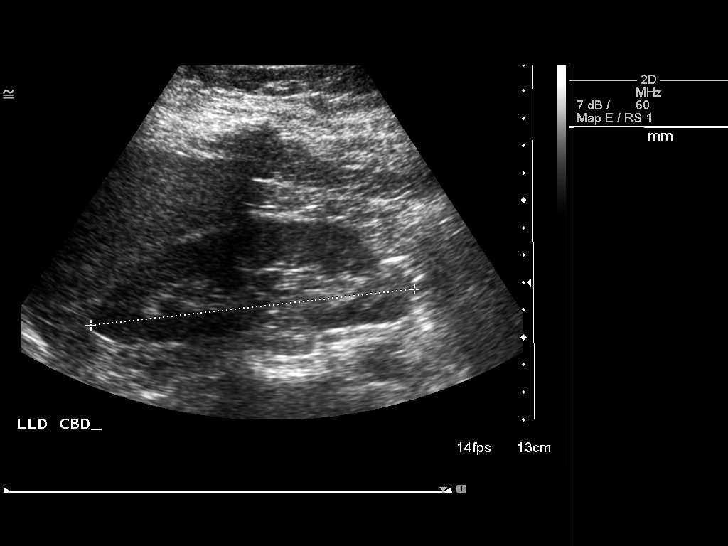
[im 60/72]
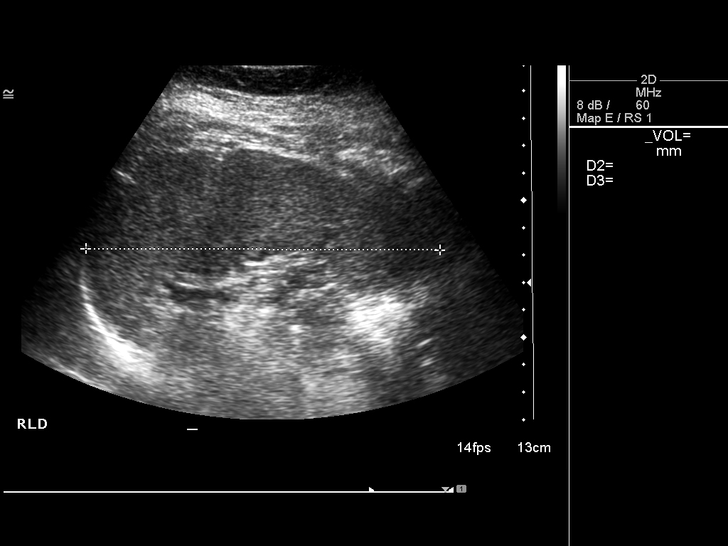
[im 66/72]
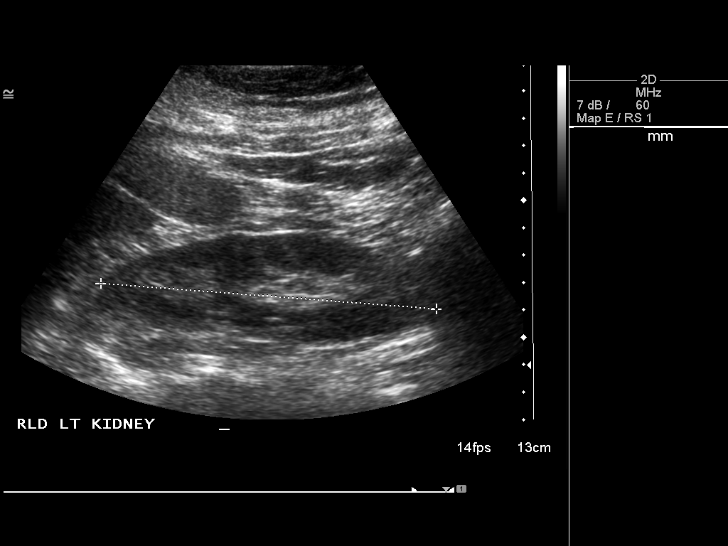
[im 72/72]
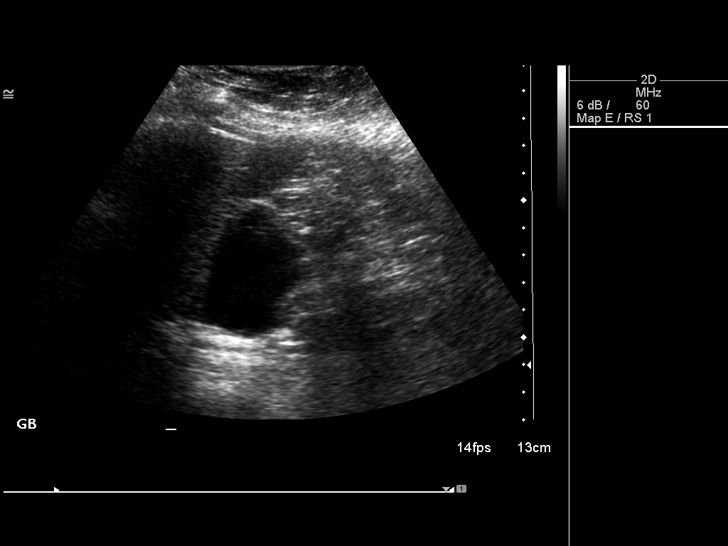

[13 of 25 positions shown; findings below may reference images not displayed]

FINDINGS: Gallbladder: No gallstones or wall thickening visualized. No
sonographic Murphy sign noted.

Common bile duct: Diameter: 3.3 mm in diameter within normal limits.

Liver: No focal lesion identified. Diffuse increased echogenicity of
the liver suspicious for fatty infiltration.

IVC: No abnormality visualized.

Pancreas: Visualized portion unremarkable.

Spleen: The spleen measures 12.9 x 12.2 x 5.1 cm. Splenic volume is
416.4 cc

Right Kidney: Length: 12.2 cm. Echogenicity within normal limits. No
mass or hydronephrosis visualized.

Left Kidney: Length: 13 cm. Echogenicity within normal limits. No
mass or hydronephrosis visualized.

Abdominal aorta: No aneurysm visualized. Measures up to 2.4 cm in
diameter.

Other findings: None.
IMPRESSION: 1. No gallstones are noted within gallbladder.
2. Mild increased echogenicity of the liver suspicious for fatty
infiltration.
3. Mild splenomegaly. Spleen measures 12.9 x 12.2 x 5.1 cm. Splenic
volume measures 416.4 cc.
4. No hydronephrosis.  No renal calculus.
5. No aortic aneurysm.

## 2014-09-21 IMAGING — CR DG CHEST 2V
2 series · 2 of 2 positions shown · non-contrast
Comparison: None.

CLINICAL DATA: Right lateral chest pain for 1 month, no known
injury

EXAM:
CHEST  2 VIEW

[w chest pa]
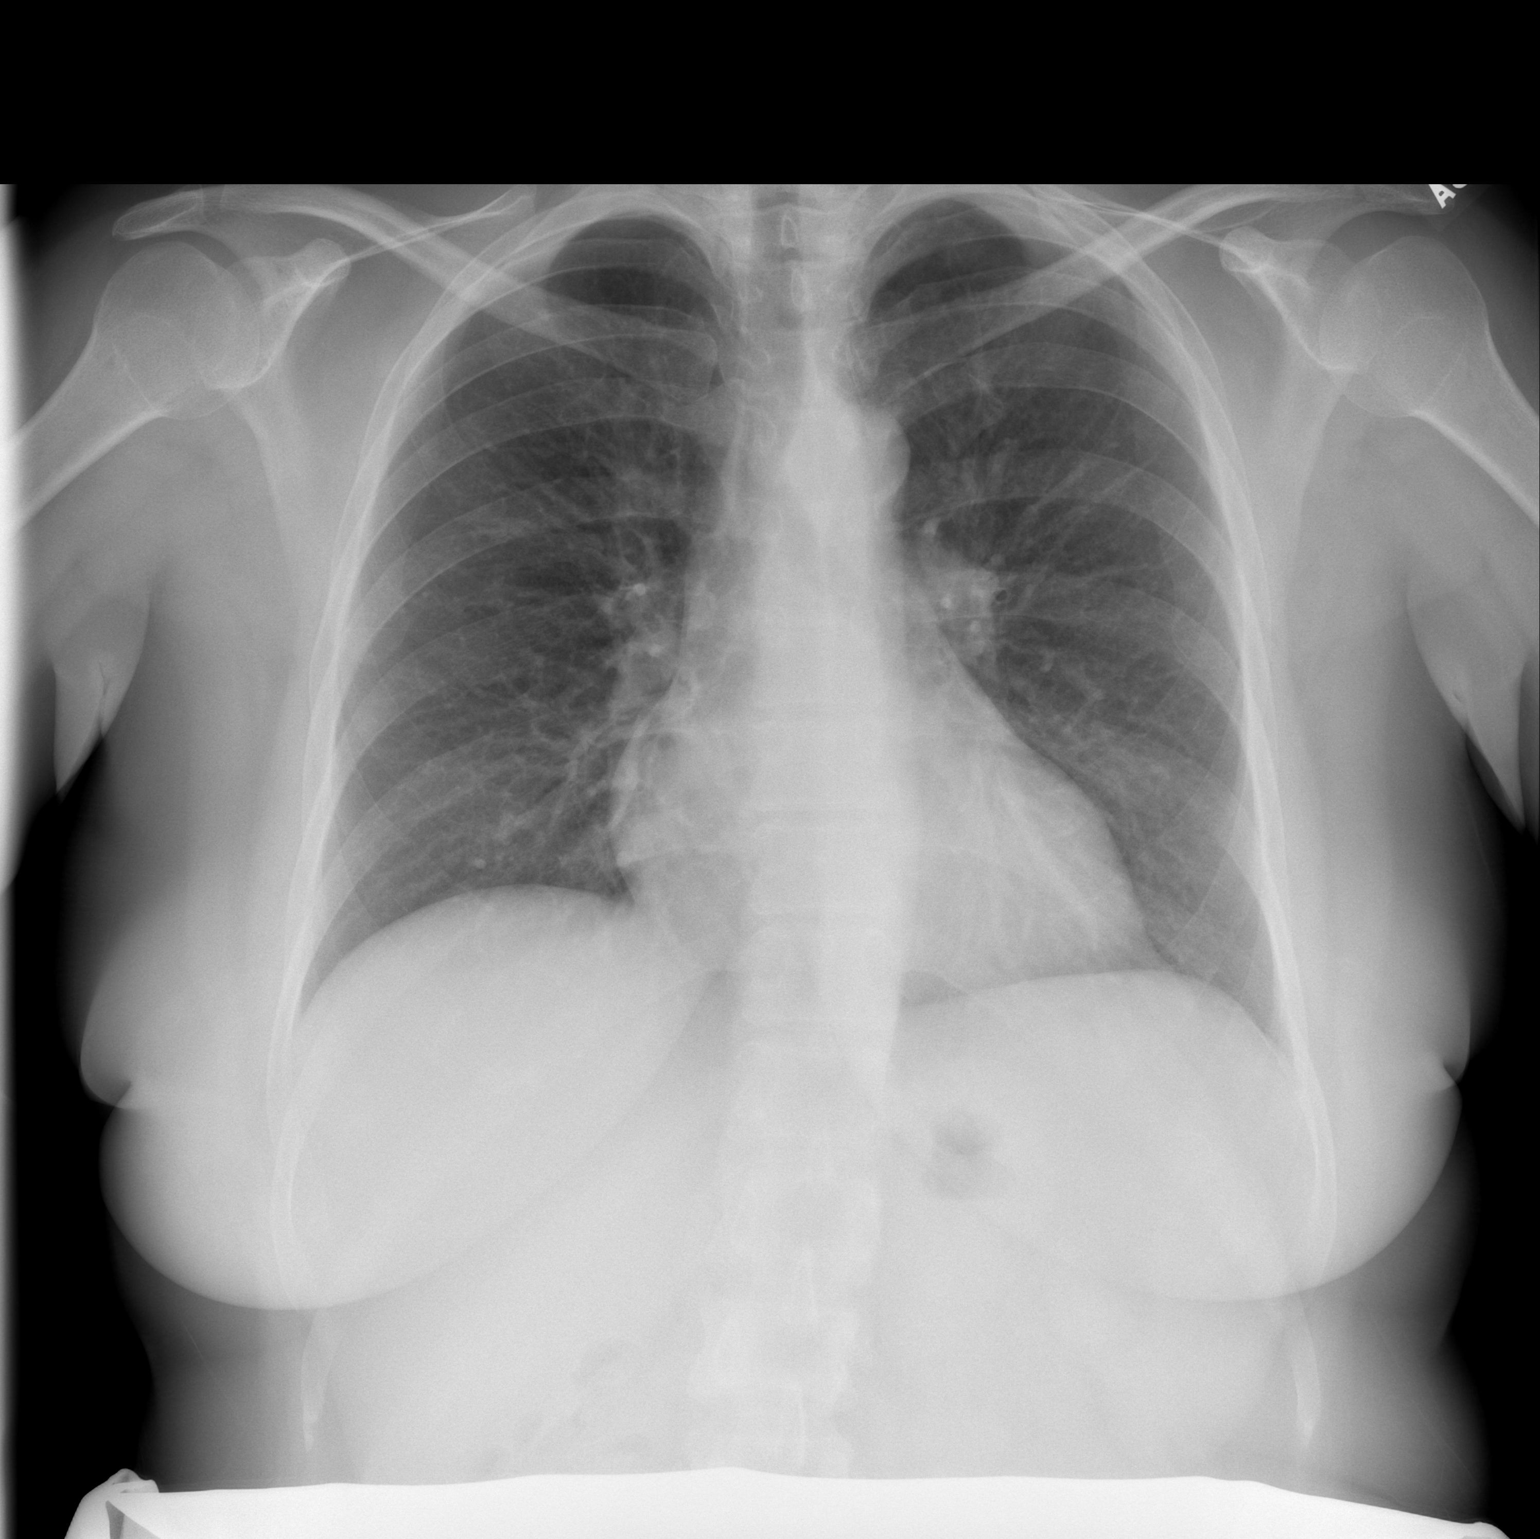

[w chest lat]
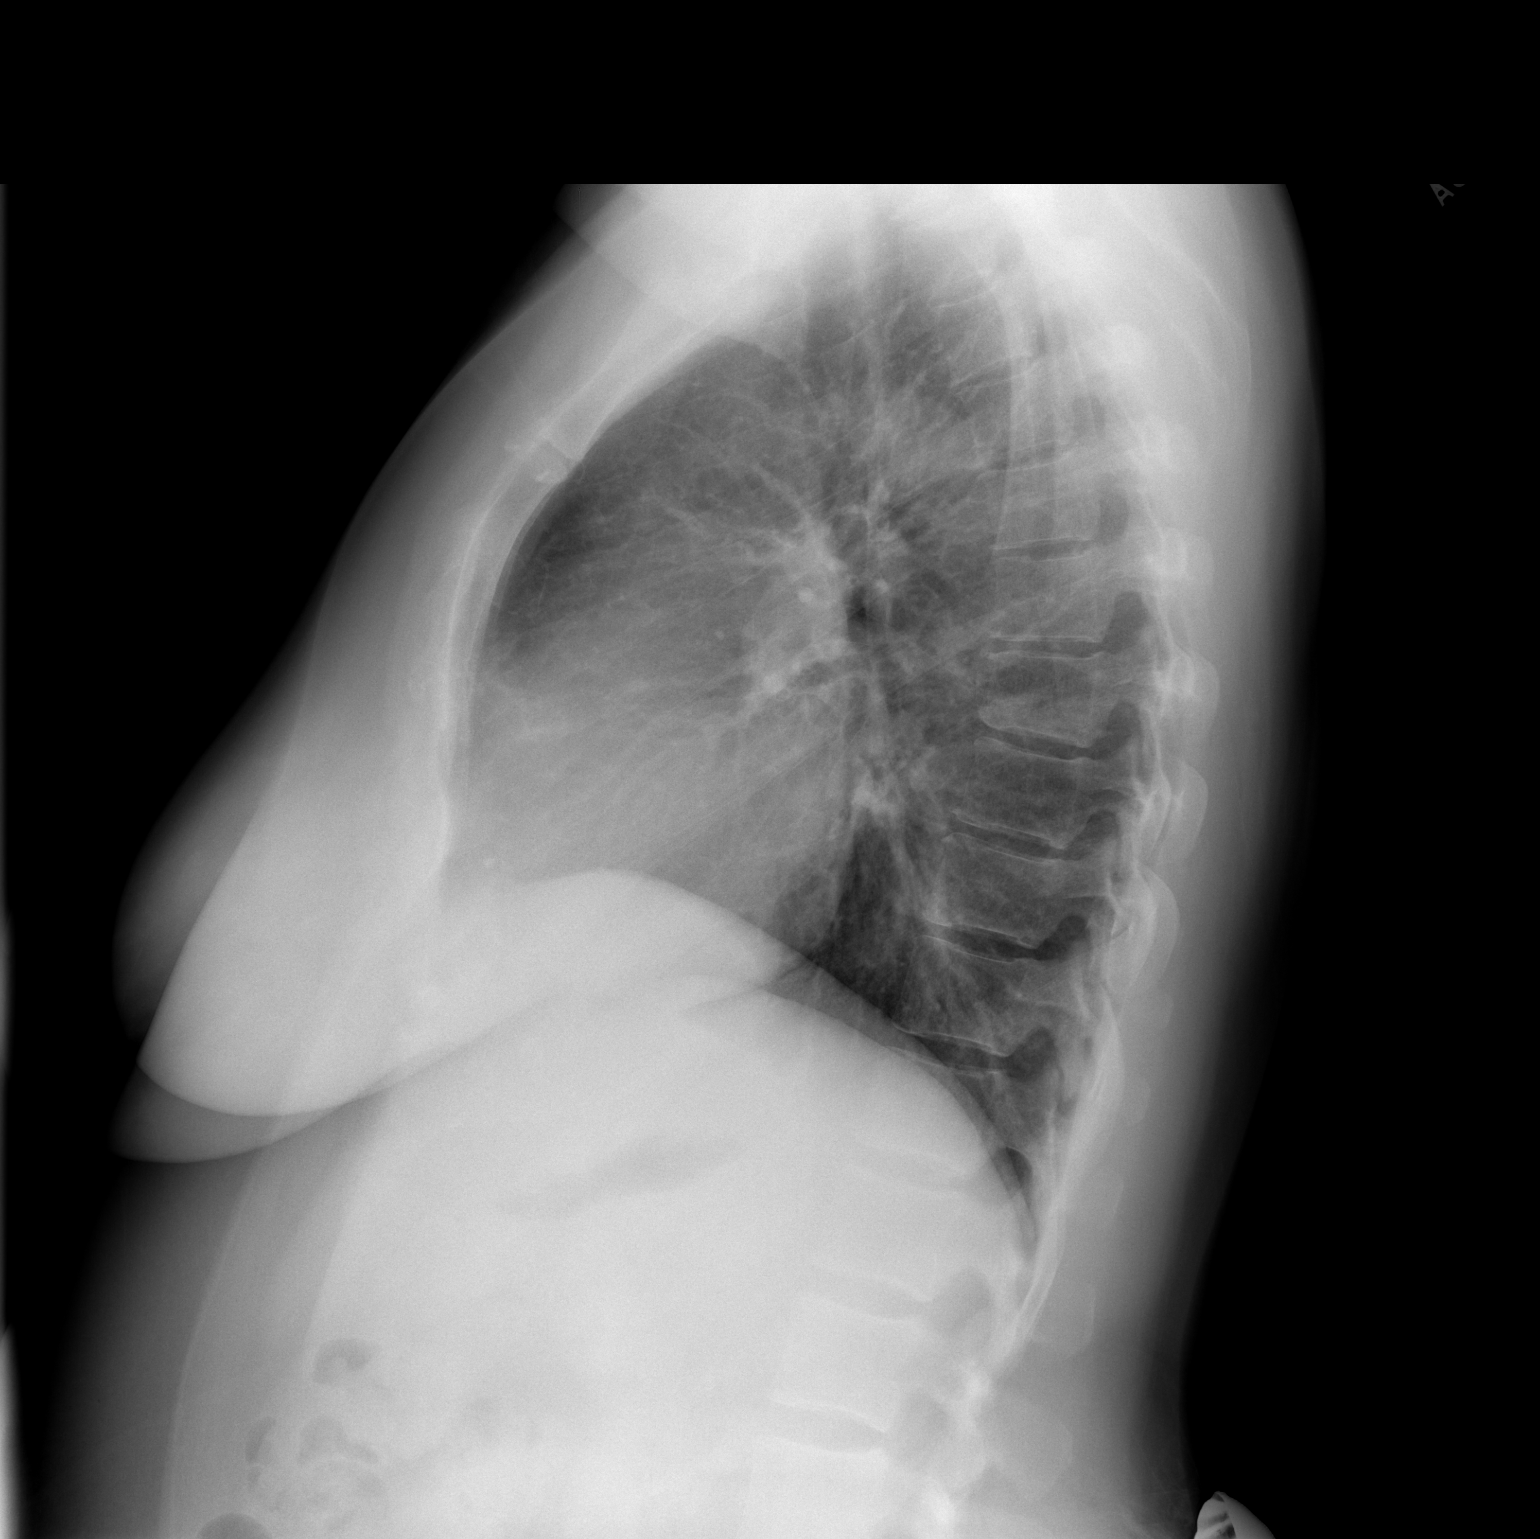

[2 of 2 positions shown; findings below may reference images not displayed]

FINDINGS: Cardiomediastinal silhouette is unremarkable. No acute infiltrate or
pleural effusion. No pulmonary edema. Bony thorax is unremarkable.
IMPRESSION: No active cardiopulmonary disease.

## 2014-09-23 ENCOUNTER — Telehealth: Payer: Self-pay | Admitting: Emergency Medicine

## 2014-09-23 DIAGNOSIS — R161 Splenomegaly, not elsewhere classified: Secondary | ICD-10-CM

## 2014-09-23 NOTE — Telephone Encounter (Signed)
-----   Message from Megan Salon, MD sent at 09/21/2014 12:57 PM EDT ----- Please inform pt chest x ray was negative for any abnormal findings.  As well, her ultrasound showed some fatty liver infiltrates which is common with obesity.  Her spleen is mildly enlarged as well.  This will need to be watched.  I want her to see her PCP regarding this and due to the RUQ pain she is having to see if he/she has other recommendations.  Thanks.

## 2014-09-23 NOTE — Telephone Encounter (Signed)
Spoke with patient and message from Dr. Sabra Heck discussed.  Patient agreeable to follow up, however does not currently have PCP.  Advised can place referral to PCP for [facility] in [place] to establish care and follow up on results. Patient agreeable. Advised would receive call from our office or [place] to schedule.

## 2014-09-23 NOTE — Telephone Encounter (Signed)
Obtained new patient appointment with Dr. Vertell Novak for first available 11/05/14 at 1600. Called patient with appointment information. She will be unable to accept this appointment due to travel. She is given Shelba Flake phone number to call and reschedule.  Routing to provider for final review. Patient agreeable to disposition. Patient aware provider will review message and nurse will return call with any additional instructions or change of disposition. Will close encounter.

## 2014-11-05 ENCOUNTER — Ambulatory Visit: Payer: Managed Care, Other (non HMO) | Admitting: Internal Medicine

## 2014-11-08 ENCOUNTER — Ambulatory Visit (INDEPENDENT_AMBULATORY_CARE_PROVIDER_SITE_OTHER): Payer: Managed Care, Other (non HMO) | Admitting: Internal Medicine

## 2014-11-08 ENCOUNTER — Other Ambulatory Visit (INDEPENDENT_AMBULATORY_CARE_PROVIDER_SITE_OTHER): Payer: Managed Care, Other (non HMO)

## 2014-11-08 ENCOUNTER — Encounter: Payer: Self-pay | Admitting: Internal Medicine

## 2014-11-08 VITALS — BP 98/70 | HR 60 | Temp 98.3°F | Resp 12 | Ht 62.0 in | Wt 216.0 lb

## 2014-11-08 DIAGNOSIS — K760 Fatty (change of) liver, not elsewhere classified: Secondary | ICD-10-CM | POA: Diagnosis not present

## 2014-11-08 DIAGNOSIS — R161 Splenomegaly, not elsewhere classified: Secondary | ICD-10-CM

## 2014-11-08 DIAGNOSIS — E669 Obesity, unspecified: Secondary | ICD-10-CM | POA: Diagnosis not present

## 2014-11-08 LAB — CBC
HCT: 36.5 % (ref 36.0–46.0)
Hemoglobin: 12.5 g/dL (ref 12.0–15.0)
MCHC: 34.2 g/dL (ref 30.0–36.0)
MCV: 88 fl (ref 78.0–100.0)
Platelets: 258 10*3/uL (ref 150.0–400.0)
RBC: 4.15 Mil/uL (ref 3.87–5.11)
RDW: 12.6 % (ref 11.5–15.5)
WBC: 8 10*3/uL (ref 4.0–10.5)

## 2014-11-08 NOTE — Assessment & Plan Note (Signed)
No known complications, lipid, CMP, normal. Talked to her about the need to exercise and work on dietary changes for some weight loss to continue having good heath.

## 2014-11-08 NOTE — Assessment & Plan Note (Signed)
Talked to her about the fact that treatment is weight loss and exercise. She will work on it. No excessive alcohol and LFTs normal.

## 2014-11-08 NOTE — Patient Instructions (Signed)
In all likelihood the spleen is perfectly normal. Your spleen was 400 cc volume, the upper limit of normal on ultrasound is about 350 cc so yours is not that different. We are checking the blood counts today to make sure that they are normal. We likely do not need any other imaging tests.   Health Maintenance Adopting a healthy lifestyle and getting preventive care can go a long way to promote health and wellness. Talk with your health care provider about what schedule of regular examinations is right for you. This is a good chance for you to check in with your provider about disease prevention and staying healthy. In between checkups, there are plenty of things you can do on your own. Experts have done a lot of research about which lifestyle changes and preventive measures are most likely to keep you healthy. Ask your health care provider for more information. WEIGHT AND DIET  Eat a healthy diet  Be sure to include plenty of vegetables, fruits, low-fat dairy products, and lean protein.  Do not eat a lot of foods high in solid fats, added sugars, or salt.  Get regular exercise. This is one of the most important things you can do for your health.  Most adults should exercise for at least 150 minutes each week. The exercise should increase your heart rate and make you sweat (moderate-intensity exercise).  Most adults should also do strengthening exercises at least twice a week. This is in addition to the moderate-intensity exercise.  Maintain a healthy weight  Body mass index (BMI) is a measurement that can be used to identify possible weight problems. It estimates body fat based on height and weight. Your health care provider can help determine your BMI and help you achieve or maintain a healthy weight.  For females 45 years of age and older:   A BMI below 18.5 is considered underweight.  A BMI of 18.5 to 24.9 is normal.  A BMI of 25 to 29.9 is considered overweight.  A BMI of 30 and  above is considered obese.  Watch levels of cholesterol and blood lipids  You should start having your blood tested for lipids and cholesterol at 45 years of age, then have this test every 5 years.  You may need to have your cholesterol levels checked more often if:  Your lipid or cholesterol levels are high.  You are older than 45 years of age.  You are at high risk for heart disease.  CANCER SCREENING   Lung Cancer  Lung cancer screening is recommended for adults 25-60 years old who are at high risk for lung cancer because of a history of smoking.  A yearly low-dose CT scan of the lungs is recommended for people who:  Currently smoke.  Have quit within the past 15 years.  Have at least a 30-pack-year history of smoking. A pack year is smoking an average of one pack of cigarettes a day for 1 year.  Yearly screening should continue until it has been 15 years since you quit.  Yearly screening should stop if you develop a health problem that would prevent you from having lung cancer treatment.  Breast Cancer  Practice breast self-awareness. This means understanding how your breasts normally appear and feel.  It also means doing regular breast self-exams. Let your health care provider know about any changes, no matter how small.  If you are in your 20s or 30s, you should have a clinical breast exam (CBE) by a health  care provider every 1-3 years as part of a regular health exam.  If you are 73 or older, have a CBE every year. Also consider having a breast X-ray (mammogram) every year.  If you have a family history of breast cancer, talk to your health care provider about genetic screening.  If you are at high risk for breast cancer, talk to your health care provider about having an MRI and a mammogram every year.  Breast cancer gene (BRCA) assessment is recommended for women who have family members with BRCA-related cancers. BRCA-related cancers  include:  Breast.  Ovarian.  Tubal.  Peritoneal cancers.  Results of the assessment will determine the need for genetic counseling and BRCA1 and BRCA2 testing. Cervical Cancer Routine pelvic examinations to screen for cervical cancer are no longer recommended for nonpregnant women who are considered low risk for cancer of the pelvic organs (ovaries, uterus, and vagina) and who do not have symptoms. A pelvic examination may be necessary if you have symptoms including those associated with pelvic infections. Ask your health care provider if a screening pelvic exam is right for you.   The Pap test is the screening test for cervical cancer for women who are considered at risk.  If you had a hysterectomy for a problem that was not cancer or a condition that could lead to cancer, then you no longer need Pap tests.  If you are older than 65 years, and you have had normal Pap tests for the past 10 years, you no longer need to have Pap tests.  If you have had past treatment for cervical cancer or a condition that could lead to cancer, you need Pap tests and screening for cancer for at least 20 years after your treatment.  If you no longer get a Pap test, assess your risk factors if they change (such as having a new sexual partner). This can affect whether you should start being screened again.  Some women have medical problems that increase their chance of getting cervical cancer. If this is the case for you, your health care provider may recommend more frequent screening and Pap tests.  The human papillomavirus (HPV) test is another test that may be used for cervical cancer screening. The HPV test looks for the virus that can cause cell changes in the cervix. The cells collected during the Pap test can be tested for HPV.  The HPV test can be used to screen women 48 years of age and older. Getting tested for HPV can extend the interval between normal Pap tests from three to five years.  An HPV  test also should be used to screen women of any age who have unclear Pap test results.  After 45 years of age, women should have HPV testing as often as Pap tests.  Colorectal Cancer  This type of cancer can be detected and often prevented.  Routine colorectal cancer screening usually begins at 45 years of age and continues through 45 years of age.  Your health care provider may recommend screening at an earlier age if you have risk factors for colon cancer.  Your health care provider may also recommend using home test kits to check for hidden blood in the stool.  A small camera at the end of a tube can be used to examine your colon directly (sigmoidoscopy or colonoscopy). This is done to check for the earliest forms of colorectal cancer.  Routine screening usually begins at age 62.  Direct examination of the  colon should be repeated every 5-10 years through 45 years of age. However, you may need to be screened more often if early forms of precancerous polyps or small growths are found. Skin Cancer  Check your skin from head to toe regularly.  Tell your health care provider about any new moles or changes in moles, especially if there is a change in a mole's shape or color.  Also tell your health care provider if you have a mole that is larger than the size of a pencil eraser.  Always use sunscreen. Apply sunscreen liberally and repeatedly throughout the day.  Protect yourself by wearing long sleeves, pants, a wide-brimmed hat, and sunglasses whenever you are outside. HEART DISEASE, DIABETES, AND HIGH BLOOD PRESSURE   Have your blood pressure checked at least every 1-2 years. High blood pressure causes heart disease and increases the risk of stroke.  If you are between 78 years and 29 years old, ask your health care provider if you should take aspirin to prevent strokes.  Have regular diabetes screenings. This involves taking a blood sample to check your fasting blood sugar  level.  If you are at a normal weight and have a low risk for diabetes, have this test once every three years after 45 years of age.  If you are overweight and have a high risk for diabetes, consider being tested at a younger age or more often. PREVENTING INFECTION  Hepatitis B  If you have a higher risk for hepatitis B, you should be screened for this virus. You are considered at high risk for hepatitis B if:  You were born in a country where hepatitis B is common. Ask your health care provider which countries are considered high risk.  Your parents were born in a high-risk country, and you have not been immunized against hepatitis B (hepatitis B vaccine).  You have HIV or AIDS.  You use needles to inject street drugs.  You live with someone who has hepatitis B.  You have had sex with someone who has hepatitis B.  You get hemodialysis treatment.  You take certain medicines for conditions, including cancer, organ transplantation, and autoimmune conditions. Hepatitis C  Blood testing is recommended for:  Everyone born from [birth year] through 1966.  Anyone with known risk factors for hepatitis C. Sexually transmitted infections (STIs)  You should be screened for sexually transmitted infections (STIs) including gonorrhea and chlamydia if:  You are sexually active and are younger than 45 years of age.  You are older than 45 years of age and your health care provider tells you that you are at risk for this type of infection.  Your sexual activity has changed since you were last screened and you are at an increased risk for chlamydia or gonorrhea. Ask your health care provider if you are at risk.  If you do not have HIV, but are at risk, it may be recommended that you take a prescription medicine daily to prevent HIV infection. This is called pre-exposure prophylaxis (PrEP). You are considered at risk if:  You are sexually active and do not regularly use condoms or know the HIV status  of your partner(s).  You take drugs by injection.  You are sexually active with a partner who has HIV. Talk with your health care provider about whether you are at high risk of being infected with HIV. If you choose to begin PrEP, you should first be tested for HIV. You should then be tested every 3 months  for as long as you are taking PrEP.  PREGNANCY   If you are premenopausal and you may become pregnant, ask your health care provider about preconception counseling.  If you may become pregnant, take 400 to 800 micrograms (mcg) of folic acid every day.  If you want to prevent pregnancy, talk to your health care provider about birth control (contraception). OSTEOPOROSIS AND MENOPAUSE   Osteoporosis is a disease in which the bones lose minerals and strength with aging. This can result in serious bone fractures. Your risk for osteoporosis can be identified using a bone density scan.  If you are 64 years of age or older, or if you are at risk for osteoporosis and fractures, ask your health care provider if you should be screened.  Ask your health care provider whether you should take a calcium or vitamin D supplement to lower your risk for osteoporosis.  Menopause may have certain physical symptoms and risks.  Hormone replacement therapy may reduce some of these symptoms and risks. Talk to your health care provider about whether hormone replacement therapy is right for you.  HOME CARE INSTRUCTIONS   Schedule regular health, dental, and eye exams.  Stay current with your immunizations.   Do not use any tobacco products including cigarettes, chewing tobacco, or electronic cigarettes.  If you are pregnant, do not drink alcohol.  If you are breastfeeding, limit how much and how often you drink alcohol.  Limit alcohol intake to no more than 1 drink per day for nonpregnant women. One drink equals 12 ounces of beer, 5 ounces of wine, or 1 ounces of hard liquor.  Do not use street  drugs.  Do not share needles.  Ask your health care provider for help if you need support or information about quitting drugs.  Tell your health care provider if you often feel depressed.  Tell your health care provider if you have ever been abused or do not feel safe at home. Document Released: 11/13/2010 Document Revised: 09/14/2013 Document Reviewed: 04/01/2013 [facility] Patient Information 2015 [place], [place]. This information is not intended to replace advice given to you by your health care provider. Make sure you discuss any questions you have with your health care provider.

## 2014-11-08 NOTE — Assessment & Plan Note (Signed)
Measurements on exam within or just over size acceptable for [place]. 13 cm by 5 cm acceptable (hers 12.9 cm by 5.1 cm). Total volume max 350 cc on [place], hers 416 cc on [place]. No symptoms and not palpable on exam (given habitus not surprising). Spleen function check with CBC today but if normal would not pursue further imaging. No known blood dyscrasias or symptoms. Do not feel brief RUQ pain related.

## 2014-11-08 NOTE — Progress Notes (Signed)
Pre visit review using our clinic review tool, if applicable. No additional management support is needed unless otherwise documented below in the visit note. 

## 2014-11-08 NOTE — Progress Notes (Signed)
   Subjective:    Patient ID: Dorothy Armstrong, female    DOB: Sep 22, 1970, 44 y.o.   MRN: [id number]  HPI The patient is a 44 YO female new coming in for splenomegaly found on imaging. She had been having some pain in the RUQ for 6 weeks and her Ob/Gyn ordered an [place]. They did not find a cause for the RUQ pain (which is now gone) but did tell her that her spleen was slightly large and liver had some fatty changes which concerned her. No pains now, no diarrhea or constipation. No blood in her stools. No fevers or chills, weight loss.   PMH, [facility], social history reviewed and updated.   Review of Systems  Constitutional: Negative for fever, activity change, appetite change, fatigue and unexpected weight change.  HENT: Negative.   Eyes: Negative.   Respiratory: Negative for cough, chest tightness, shortness of breath and wheezing.   Cardiovascular: Negative for chest pain, palpitations and leg swelling.  Gastrointestinal: Negative for nausea, abdominal pain, diarrhea, constipation and abdominal distention.  Musculoskeletal: Negative.   Skin: Negative.   Neurological: Negative.   Psychiatric/Behavioral: Negative.       Objective:   Physical Exam  Constitutional: She is oriented to person, place, and time. She appears well-developed and well-nourished.  HENT:  Head: Normocephalic and atraumatic.  Eyes: EOM are normal.  Neck: Normal range of motion.  Cardiovascular: Normal rate and regular rhythm.   Pulmonary/Chest: Effort normal. No respiratory distress. She has no wheezes. She has no rales.  Abdominal: Soft. She exhibits no distension and no mass. There is no tenderness. There is no rebound and no guarding.  Spleen not appreciated  Musculoskeletal: She exhibits no edema.  Neurological: She is alert and oriented to person, place, and time. Coordination normal.  Skin: Skin is warm and dry.  Psychiatric: She has a normal mood and affect.   Filed Vitals:   11/08/14 1009  BP: 98/70  Pulse:  60  Temp: 98.3 F (36.8 C)  TempSrc: Oral  Resp: 12  Height: 5\' 2"  (1.575 m)  Weight: 216 lb (97.977 kg)      Assessment & Plan:

## 2015-01-24 ENCOUNTER — Other Ambulatory Visit: Payer: Self-pay

## 2015-01-24 MED ORDER — PAROXETINE HCL 20 MG PO TABS: 20.0000 mg | ORAL_TABLET | Freq: Every day | ORAL | Status: DC

## 2015-01-24 NOTE — Telephone Encounter (Signed)
Medication refill request: Paroxentine Last AEX:  06/15/13 with SM Next AEX: 09/07/15 with SM Last MMG (if hormonal medication request):  Refill authorized: please advise

## 2015-04-14 ENCOUNTER — Telehealth: Payer: Self-pay | Admitting: Obstetrics & Gynecology

## 2015-04-14 NOTE — Telephone Encounter (Signed)
Left patient  messages to call back to reschedule a future appointment that was cancelled by the provider. °

## 2015-08-30 ENCOUNTER — Ambulatory Visit (INDEPENDENT_AMBULATORY_CARE_PROVIDER_SITE_OTHER): Payer: Managed Care, Other (non HMO) | Admitting: Obstetrics & Gynecology

## 2015-08-30 ENCOUNTER — Encounter: Payer: Self-pay | Admitting: Obstetrics & Gynecology

## 2015-08-30 VITALS — BP 110/70 | HR 66 | Resp 16 | Ht 62.5 in | Wt 224.0 lb

## 2015-08-30 DIAGNOSIS — Z0000 Encounter for general adult medical examination without abnormal findings: Secondary | ICD-10-CM

## 2015-08-30 DIAGNOSIS — Z975 Presence of (intrauterine) contraceptive device: Secondary | ICD-10-CM | POA: Diagnosis not present

## 2015-08-30 DIAGNOSIS — Z01419 Encounter for gynecological examination (general) (routine) without abnormal findings: Secondary | ICD-10-CM | POA: Diagnosis not present

## 2015-08-30 LAB — POCT URINALYSIS DIPSTICK
Bilirubin, UA: NEGATIVE
Blood, UA: NEGATIVE
Glucose, UA: NEGATIVE
Ketones, UA: NEGATIVE
Nitrite, UA: NEGATIVE
Protein, UA: NEGATIVE
Urobilinogen, UA: NEGATIVE
pH, UA: 5

## 2015-08-30 LAB — COMPREHENSIVE METABOLIC PANEL
ALT: 14 U/L (ref 6–29)
AST: 17 U/L (ref 10–35)
Albumin: 4.1 g/dL (ref 3.6–5.1)
Alkaline Phosphatase: 75 U/L (ref 33–115)
BUN: 10 mg/dL (ref 7–25)
CO2: 25 mmol/L (ref 20–31)
Calcium: 9.1 mg/dL (ref 8.6–10.2)
Chloride: 102 mmol/L (ref 98–110)
Creat: 0.7 mg/dL (ref 0.50–1.10)
Glucose, Bld: 88 mg/dL (ref 65–99)
Potassium: 3.7 mmol/L (ref 3.5–5.3)
Sodium: 135 mmol/L (ref 135–146)
Total Bilirubin: 0.5 mg/dL (ref 0.2–1.2)
Total Protein: 7.1 g/dL (ref 6.1–8.1)

## 2015-08-30 LAB — LIPID PANEL
Cholesterol: 149 mg/dL (ref 125–200)
HDL: 43 mg/dL — ABNORMAL LOW (ref >=46)
LDL Cholesterol: 80 mg/dL (ref <130)
Total CHOL/HDL Ratio: 3.5 Ratio (ref <=5.0)
Triglycerides: 128 mg/dL (ref <150)
VLDL: 26 mg/dL (ref <30)

## 2015-08-30 LAB — HEMOGLOBIN, FINGERSTICK: Hemoglobin, fingerstick: 12.1 g/dL (ref 12.0–16.0)

## 2015-08-30 MED ORDER — PAROXETINE HCL 20 MG PO TABS: 20.0000 mg | ORAL_TABLET | Freq: Every day | ORAL | Status: DC

## 2015-08-30 NOTE — Progress Notes (Signed)
45 y.o. G2P2 MarriedCaucasianF here for annual exam.  Doing well.  Having minimal vaginal bleeding.  Reports she hasn't cycled in several months.    Patient's last menstrual period was 08/30/2014.          Sexually active: Yes.    The current method of family planning is IUD.    Exercising: No.  The patient does not participate in regular exercise at present. Smoker:  no  Health Maintenance: Pap:  06/28/14 Neg, neg HR HPV 2014.  History of abnormal Pap:  yes MMG:  08/17/14 BIRADS3:probably Benign. Pt will make appt  Colonoscopy:  None BMD:   None TDaP:  2011 per pt.  This was before going to [place] so pt is sure about this date. Screening Labs: Here, Hb today: pending, Urine today: WBC=Trace   reports that she has never smoked. She has never used smokeless tobacco. She reports that she drinks alcohol. She reports that she does not use illicit drugs.  Past Medical History  Diagnosis Date  . IBS (irritable bowel syndrome)   . Hiatal hernia   . Kidney stone   . Ankle fracture   . Arthritis     bilateral knee    Past Surgical History  Procedure Laterality Date  . Wisdom tooth extraction    . Cesarean section  2001, 2004    x2  . Cystoscopy/retrograde/ureteroscopy/stone extraction with basket  2001    Current Outpatient Prescriptions  Medication Sig Dispense Refill  . diclofenac sodium (VOLTAREN) 1 % GEL APPLY 2-4 GRAMS TO AFFECTED AREA THREE TIMES DAILY AS NEEDED.  1  . glucosamine-chondroitin 500-400 MG tablet Take 1 tablet by mouth 3 (three) times daily.    Marland Kitchen loratadine (CLARITIN) 10 MG tablet Take 10 mg by mouth daily.    . Multiple Vitamins-Minerals (MULTIVITAMIN PO) Take by mouth daily.    Marland Kitchen PARoxetine (PAXIL) 20 MG tablet Take 1 tablet (20 mg total) by mouth daily. 90 tablet 2  . Vitamin D, Ergocalciferol, (DRISDOL) 50000 UNITS CAPS capsule Take 1 capsule (50,000 Units total) by mouth every 7 (seven) days. 12 capsule 0   No current facility-administered medications for  this visit.    Family History  Problem Relation Age of Onset  . Breast cancer Paternal Aunt   . Hypertension Father   . Hypertension Paternal Grandfather   . CVA Paternal Grandfather   . Colon cancer Paternal Grandmother     ROS:  Pertinent items are noted in HPI.  Otherwise, a comprehensive ROS was negative.  Exam:   BP 110/70 mmHg  Pulse 66  Resp 16  Ht 5' 2.5" (1.588 m)  Wt 224 lb (101.606 kg)  BMI 40.29 kg/m2  LMP 08/30/2014  Weight change: +9#  Height: 5' 2.5" (158.8 cm)  Ht Readings from Last 3 Encounters:  08/30/15 5' 2.5" (1.588 m)  11/08/14 5\' 2"  (1.575 m)  06/28/14 5' 2.5" (1.588 m)    General appearance: alert, cooperative and appears stated age Head: Normocephalic, without obvious abnormality, atraumatic Neck: no adenopathy, supple, symmetrical, trachea midline and thyroid normal to inspection and palpation Lungs: clear to auscultation bilaterally Breasts: normal appearance, no masses or tenderness Heart: regular rate and rhythm Abdomen: soft, non-tender; bowel sounds normal; no masses,  no organomegaly Extremities: extremities normal, atraumatic, no cyanosis or edema Skin: Skin color, texture, turgor normal. No rashes or lesions Lymph nodes: Cervical, supraclavicular, and axillary nodes normal. No abnormal inguinal nodes palpated Neurologic: Grossly normal   Pelvic: External genitalia:  no lesions  Urethra:  normal appearing urethra with no masses, tenderness or lesions              Bartholins and Skenes: normal                 Vagina: normal appearing vagina with normal color and discharge, no lesions              Cervix: no lesions              Pap taken: No. Bimanual Exam:  Uterus:  normal size, contour, position, consistency, mobility, non-tender, IUD string noted              Adnexa: normal adnexa and no mass, fullness, tenderness               Rectovaginal: Confirms               Anus:  normal sphincter tone, no lesions  Chaperone  was present for exam.   A: Well Woman with normal exam Menorrhagia, significantly improved with Mirena IUD placed 07/29/14.   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 Had breast calcifications followed for two years. Pap smear with neg HR HPV 1/14. Pap only 2016. Paxil 20mg  daily. #90/4RF CMP, Lipids, Vit D D/W pt weight loss options and options for treatment.  She is considering weight watchers. Return annually or prn

## 2015-08-31 LAB — VITAMIN D 25 HYDROXY (VIT D DEFICIENCY, FRACTURES): Vit D, 25-Hydroxy: 23 ng/mL — ABNORMAL LOW (ref 30–100)

## 2015-08-31 MED ORDER — VITAMIN D (ERGOCALCIFEROL) 1.25 MG (50000 UNIT) PO CAPS: 50000.0000 [IU] | ORAL_CAPSULE | ORAL | Status: DC

## 2015-08-31 NOTE — Addendum Note (Signed)
Addended by: Megan Salon on: 08/31/2015 07:02 AM   Modules accepted: Orders, SmartSet

## 2015-09-01 ENCOUNTER — Telehealth: Payer: Self-pay | Admitting: *Deleted

## 2015-09-01 NOTE — Telephone Encounter (Signed)
Notes Recorded by Elroy Channel, CMA on 09/01/2015 at 1:15 PM LM for pt to call back. Notes Recorded by Megan Salon, MD on 08/31/2015 at 7:01 AM Please notify pt that her CMP was normal and lipids were good except HDLs are low. This is risk factor for cardiovascular disease. Weight loss and exercise would improve this level. We disucssed weight loss at her office visit. Her Vit D is low at 23. I think she needs to go back on the Vit D 50K weekly and just continue it. Will repeat in one year. Rx completed.

## 2015-09-01 NOTE — Telephone Encounter (Signed)
Wellness Screening Form faxed. Form sent to scan.

## 2015-09-06 ENCOUNTER — Ambulatory Visit: Payer: Managed Care, Other (non HMO) | Admitting: Obstetrics & Gynecology

## 2015-09-08 NOTE — Telephone Encounter (Signed)
LM for pt to call back. Second attempt  

## 2015-09-12 ENCOUNTER — Ambulatory Visit: Payer: Managed Care, Other (non HMO) | Admitting: Obstetrics & Gynecology

## 2015-09-12 NOTE — Telephone Encounter (Signed)
Patient notified see result note 

## 2015-10-13 ENCOUNTER — Encounter: Payer: Self-pay | Admitting: Obstetrics & Gynecology

## 2016-01-30 ENCOUNTER — Ambulatory Visit (INDEPENDENT_AMBULATORY_CARE_PROVIDER_SITE_OTHER): Payer: Managed Care, Other (non HMO) | Admitting: Internal Medicine

## 2016-01-30 ENCOUNTER — Encounter: Payer: Self-pay | Admitting: Internal Medicine

## 2016-01-30 DIAGNOSIS — Z0000 Encounter for general adult medical examination without abnormal findings: Secondary | ICD-10-CM | POA: Diagnosis not present

## 2016-01-30 DIAGNOSIS — Z23 Encounter for immunization: Secondary | ICD-10-CM

## 2016-01-30 DIAGNOSIS — E669 Obesity, unspecified: Secondary | ICD-10-CM | POA: Diagnosis not present

## 2016-01-30 NOTE — Patient Instructions (Signed)
We are not checking any blood work today.   Keep up the good work on exercise to help keep the health good.   Health Maintenance, Female Adopting a healthy lifestyle and getting preventive care can go a long way to promote health and wellness. Talk with your health care provider about what schedule of regular examinations is right for you. This is a good chance for you to check in with your provider about disease prevention and staying healthy. In between checkups, there are plenty of things you can do on your own. Experts have done a lot of research about which lifestyle changes and preventive measures are most likely to keep you healthy. Ask your health care provider for more information. WEIGHT AND DIET  Eat a healthy diet  Be sure to include plenty of vegetables, fruits, low-fat dairy products, and lean protein.  Do not eat a lot of foods high in solid fats, added sugars, or salt.  Get regular exercise. This is one of the most important things you can do for your health.  Most adults should exercise for at least 150 minutes each week. The exercise should increase your heart rate and make you sweat (moderate-intensity exercise).  Most adults should also do strengthening exercises at least twice a week. This is in addition to the moderate-intensity exercise.  Maintain a healthy weight  Body mass index (BMI) is a measurement that can be used to identify possible weight problems. It estimates body fat based on height and weight. Your health care provider can help determine your BMI and help you achieve or maintain a healthy weight.  For females 55 years of age and older:   A BMI below 18.5 is considered underweight.  A BMI of 18.5 to 24.9 is normal.  A BMI of 25 to 29.9 is considered overweight.  A BMI of 30 and above is considered obese.  Watch levels of cholesterol and blood lipids  You should start having your blood tested for lipids and cholesterol at 46 years of age, then  have this test every 5 years.  You may need to have your cholesterol levels checked more often if:  Your lipid or cholesterol levels are high.  You are older than 46 years of age.  You are at high risk for heart disease.  CANCER SCREENING   Lung Cancer  Lung cancer screening is recommended for adults 37-59 years old who are at high risk for lung cancer because of a history of smoking.  A yearly low-dose CT scan of the lungs is recommended for people who:  Currently smoke.  Have quit within the past 15 years.  Have at least a 30-pack-year history of smoking. A pack year is smoking an average of one pack of cigarettes a day for 1 year.  Yearly screening should continue until it has been 15 years since you quit.  Yearly screening should stop if you develop a health problem that would prevent you from having lung cancer treatment.  Breast Cancer  Practice breast self-awareness. This means understanding how your breasts normally appear and feel.  It also means doing regular breast self-exams. Let your health care provider know about any changes, no matter how small.  If you are in your 20s or 30s, you should have a clinical breast exam (CBE) by a health care provider every 1-3 years as part of a regular health exam.  If you are 26 or older, have a CBE every year. Also consider having a breast X-ray (  mammogram) every year.  If you have a family history of breast cancer, talk to your health care provider about genetic screening.  If you are at high risk for breast cancer, talk to your health care provider about having an MRI and a mammogram every year.  Breast cancer gene (BRCA) assessment is recommended for women who have family members with BRCA-related cancers. BRCA-related cancers include:  Breast.  Ovarian.  Tubal.  Peritoneal cancers.  Results of the assessment will determine the need for genetic counseling and BRCA1 and BRCA2 testing. Cervical Cancer Your health  care provider may recommend that you be screened regularly for cancer of the pelvic organs (ovaries, uterus, and vagina). This screening involves a pelvic examination, including checking for microscopic changes to the surface of your cervix (Pap test). You may be encouraged to have this screening done every 3 years, beginning at age 31.  For women ages 22-65, health care providers may recommend pelvic exams and Pap testing every 3 years, or they may recommend the Pap and pelvic exam, combined with testing for human papilloma virus (HPV), every 5 years. Some types of HPV increase your risk of cervical cancer. Testing for HPV may also be done on women of any age with unclear Pap test results.  Other health care providers may not recommend any screening for nonpregnant women who are considered low risk for pelvic cancer and who do not have symptoms. Ask your health care provider if a screening pelvic exam is right for you.  If you have had past treatment for cervical cancer or a condition that could lead to cancer, you need Pap tests and screening for cancer for at least 20 years after your treatment. If Pap tests have been discontinued, your risk factors (such as having a new sexual partner) need to be reassessed to determine if screening should resume. Some women have medical problems that increase the chance of getting cervical cancer. In these cases, your health care provider may recommend more frequent screening and Pap tests. Colorectal Cancer  This type of cancer can be detected and often prevented.  Routine colorectal cancer screening usually begins at 46 years of age and continues through 46 years of age.  Your health care provider may recommend screening at an earlier age if you have risk factors for colon cancer.  Your health care provider may also recommend using home test kits to check for hidden blood in the stool.  A small camera at the end of a tube can be used to examine your colon  directly (sigmoidoscopy or colonoscopy). This is done to check for the earliest forms of colorectal cancer.  Routine screening usually begins at age 37.  Direct examination of the colon should be repeated every 5-10 years through 46 years of age. However, you may need to be screened more often if early forms of precancerous polyps or small growths are found. Skin Cancer  Check your skin from head to toe regularly.  Tell your health care provider about any new moles or changes in moles, especially if there is a change in a mole's shape or color.  Also tell your health care provider if you have a mole that is larger than the size of a pencil eraser.  Always use sunscreen. Apply sunscreen liberally and repeatedly throughout the day.  Protect yourself by wearing long sleeves, pants, a wide-brimmed hat, and sunglasses whenever you are outside. HEART DISEASE, DIABETES, AND HIGH BLOOD PRESSURE   High blood pressure causes heart disease  and increases the risk of stroke. High blood pressure is more likely to develop in:  People who have blood pressure in the high end of the normal range (130-139/85-89 mm Hg).  People who are overweight or obese.  People who are African American.  If you are 76-17 years of age, have your blood pressure checked every 3-5 years. If you are 62 years of age or older, have your blood pressure checked every year. You should have your blood pressure measured twice--once when you are at a hospital or clinic, and once when you are not at a hospital or clinic. Record the average of the two measurements. To check your blood pressure when you are not at a hospital or clinic, you can use:  An automated blood pressure machine at a pharmacy.  A home blood pressure monitor.  If you are between 70 years and 31 years old, ask your health care provider if you should take aspirin to prevent strokes.  Have regular diabetes screenings. This involves taking a blood sample to check  your fasting blood sugar level.  If you are at a normal weight and have a low risk for diabetes, have this test once every three years after 46 years of age.  If you are overweight and have a high risk for diabetes, consider being tested at a younger age or more often. PREVENTING INFECTION  Hepatitis B  If you have a higher risk for hepatitis B, you should be screened for this virus. You are considered at high risk for hepatitis B if:  You were born in a country where hepatitis B is common. Ask your health care provider which countries are considered high risk.  Your parents were born in a high-risk country, and you have not been immunized against hepatitis B (hepatitis B vaccine).  You have HIV or AIDS.  You use needles to inject street drugs.  You live with someone who has hepatitis B.  You have had sex with someone who has hepatitis B.  You get hemodialysis treatment.  You take certain medicines for conditions, including cancer, organ transplantation, and autoimmune conditions. Hepatitis C  Blood testing is recommended for:  Everyone born from [birth year] through 1966.  Anyone with known risk factors for hepatitis C. Sexually transmitted infections (STIs)  You should be screened for sexually transmitted infections (STIs) including gonorrhea and chlamydia if:  You are sexually active and are younger than 46 years of age.  You are older than 46 years of age and your health care provider tells you that you are at risk for this type of infection.  Your sexual activity has changed since you were last screened and you are at an increased risk for chlamydia or gonorrhea. Ask your health care provider if you are at risk.  If you do not have HIV, but are at risk, it may be recommended that you take a prescription medicine daily to prevent HIV infection. This is called pre-exposure prophylaxis (PrEP). You are considered at risk if:  You are sexually active and do not regularly use  condoms or know the HIV status of your partner(s).  You take drugs by injection.  You are sexually active with a partner who has HIV. Talk with your health care provider about whether you are at high risk of being infected with HIV. If you choose to begin PrEP, you should first be tested for HIV. You should then be tested every 3 months for as long as you are taking PrEP.  PREGNANCY   If you are premenopausal and you may become pregnant, ask your health care provider about preconception counseling.  If you may become pregnant, take 400 to 800 micrograms (mcg) of folic acid every day.  If you want to prevent pregnancy, talk to your health care provider about birth control (contraception). OSTEOPOROSIS AND MENOPAUSE   Osteoporosis is a disease in which the bones lose minerals and strength with aging. This can result in serious bone fractures. Your risk for osteoporosis can be identified using a bone density scan.  If you are 41 years of age or older, or if you are at risk for osteoporosis and fractures, ask your health care provider if you should be screened.  Ask your health care provider whether you should take a calcium or vitamin D supplement to lower your risk for osteoporosis.  Menopause may have certain physical symptoms and risks.  Hormone replacement therapy may reduce some of these symptoms and risks. Talk to your health care provider about whether hormone replacement therapy is right for you.  HOME CARE INSTRUCTIONS   Schedule regular health, dental, and eye exams.  Stay current with your immunizations.   Do not use any tobacco products including cigarettes, chewing tobacco, or electronic cigarettes.  If you are pregnant, do not drink alcohol.  If you are breastfeeding, limit how much and how often you drink alcohol.  Limit alcohol intake to no more than 1 drink per day for nonpregnant women. One drink equals 12 ounces of beer, 5 ounces of wine, or 1 ounces of hard  liquor.  Do not use street drugs.  Do not share needles.  Ask your health care provider for help if you need support or information about quitting drugs.  Tell your health care provider if you often feel depressed.  Tell your health care provider if you have ever been abused or do not feel safe at home.   This information is not intended to replace advice given to you by your health care provider. Make sure you discuss any questions you have with your health care provider.   Document Released: 11/13/2010 Document Revised: 05/21/2014 Document Reviewed: 04/01/2013 Elsevier Interactive Patient Education [employer].

## 2016-01-30 NOTE — Assessment & Plan Note (Signed)
Reviewed recent labs with her and health form signed. Counseled about sun safety and mole surveillance as well as the dangers of distracted driving. Given screening recommendations.

## 2016-01-30 NOTE — Progress Notes (Signed)
   Subjective:    Patient ID: Dorothy Armstrong, female    DOB: 05-22-1970, 45 y.o.   MRN: [id number]  HPI The patient is a 45 YO female coming in for CPE. No new concerns. Sees derm yearly for hx melanoma.   PMH, [facility], social history reviewed and updated.   Review of Systems  Constitutional: Negative for activity change, appetite change, fatigue, fever and unexpected weight change.  HENT: Negative.   Eyes: Negative.   Respiratory: Negative for cough, chest tightness, shortness of breath and wheezing.   Cardiovascular: Negative for chest pain, palpitations and leg swelling.  Gastrointestinal: Negative for abdominal distention, abdominal pain, constipation, diarrhea and nausea.  Musculoskeletal: Negative.   Skin: Negative.   Neurological: Negative.   Psychiatric/Behavioral: Negative.       Objective:   Physical Exam  Constitutional: She is oriented to person, place, and time. She appears well-developed and well-nourished.  HENT:  Head: Normocephalic and atraumatic.  Eyes: EOM are normal.  Neck: Normal range of motion.  Cardiovascular: Normal rate and regular rhythm.   Pulmonary/Chest: Effort normal. No respiratory distress. She has no wheezes. She has no rales.  Abdominal: Soft. Bowel sounds are normal. She exhibits no distension and no mass. There is no tenderness. There is no rebound and no guarding.  Musculoskeletal: She exhibits no edema.  Neurological: She is alert and oriented to person, place, and time. Coordination normal.  Skin: Skin is warm and dry.  Psychiatric: She has a normal mood and affect.   Vitals:   01/30/16 1502  BP: 120/70  Pulse: 86  Resp: 14  Temp: 98.3 F (36.8 C)  TempSrc: Oral  SpO2: 97%  Weight: 226 lb (102.5 kg)  Height: 5\' 2"  (1.575 m)      Assessment & Plan:  Flu shot given at visit.

## 2016-01-30 NOTE — Progress Notes (Signed)
Pre visit review using our clinic review tool, if applicable. No additional management support is needed unless otherwise documented below in the visit note. 

## 2016-01-30 NOTE — Assessment & Plan Note (Signed)
She is now doing 10K steps per day in an effort to exercise.

## 2016-10-16 ENCOUNTER — Encounter: Payer: Self-pay | Admitting: Obstetrics & Gynecology

## 2016-10-16 ENCOUNTER — Ambulatory Visit: Payer: Managed Care, Other (non HMO) | Admitting: Obstetrics & Gynecology

## 2016-10-16 NOTE — Progress Notes (Deleted)
46 y.o. G2P2 MarriedCaucasianF here for annual exam.    No LMP recorded. Patient is not currently having periods (Reason: IUD).          Sexually active: {yes no:314532}  The current method of family planning is {contraception:315051}.    Exercising: {yes no:314532}  {types:19826} Smoker:  {YES NO:22349}  Health Maintenance: Pap:  06/28/14 Neg   2014 Neg. HR HPV:neg  History of abnormal Pap:  {YES NO:22349} MMG:  09/29/15 BIRADS2:Benign  Colonoscopy:  Never BMD:   Never  TDaP:  2011  Pneumonia vaccine(s):  *** Zostavax:   *** Hep C testing: *** Screening Labs: ***, Hb today: ***, Urine today: ***   reports that she has never smoked. She has never used smokeless tobacco. She reports that she drinks alcohol. She reports that she does not use drugs.  Past Medical History:  Diagnosis Date  . Ankle fracture   . Arthritis    bilateral knee  . Hiatal hernia   . IBS (irritable bowel syndrome)   . Kidney stone     Past Surgical History:  Procedure Laterality Date  . CESAREAN SECTION  2001, 2004   x2  . CYSTOSCOPY/RETROGRADE/URETEROSCOPY/STONE EXTRACTION WITH BASKET  2001  . WISDOM TOOTH EXTRACTION      Current Outpatient Prescriptions  Medication Sig Dispense Refill  . glucosamine-chondroitin 500-400 MG tablet Take 1 tablet by mouth 3 (three) times daily.    . Multiple Vitamins-Minerals (MULTIVITAMIN PO) Take by mouth daily.    Marland Kitchen PARoxetine (PAXIL) 20 MG tablet Take 1 tablet (20 mg total) by mouth daily. 90 tablet 4  . Vitamin D, Ergocalciferol, (DRISDOL) 50000 units CAPS capsule Take 1 capsule (50,000 Units total) by mouth every 7 (seven) days. 12 capsule 4   No current facility-administered medications for this visit.     Family History  Problem Relation Age of Onset  . Breast cancer Paternal Aunt   . Hypertension Father   . Hypertension Paternal Grandfather   . CVA Paternal Grandfather   . Colon cancer Paternal Grandmother     ROS:  Pertinent items are noted in  HPI.  Otherwise, a comprehensive ROS was negative.  Exam:   There were no vitals taken for this visit.  Weight change: @WEIGHTCHANGE @ Height:      Ht Readings from Last 3 Encounters:  01/30/16 5\' 2"  (1.575 m)  08/30/15 5' 2.5" (1.588 m)  11/08/14 5\' 2"  (1.575 m)    General appearance: alert, cooperative and appears stated age Head: Normocephalic, without obvious abnormality, atraumatic Neck: no adenopathy, supple, symmetrical, trachea midline and thyroid {EXAM; THYROID:18604} Lungs: clear to auscultation bilaterally Breasts: {Exam; breast:13139::"normal appearance, no masses or tenderness"} Heart: regular rate and rhythm Abdomen: soft, non-tender; bowel sounds normal; no masses,  no organomegaly Extremities: extremities normal, atraumatic, no cyanosis or edema Skin: Skin color, texture, turgor normal. No rashes or lesions Lymph nodes: Cervical, supraclavicular, and axillary nodes normal. No abnormal inguinal nodes palpated Neurologic: Grossly normal   Pelvic: External genitalia:  no lesions              Urethra:  normal appearing urethra with no masses, tenderness or lesions              Bartholins and Skenes: normal                 Vagina: normal appearing vagina with normal color and discharge, no lesions              Cervix: {exam; cervix:14595}  Pap taken: {yes no:314532} Bimanual Exam:  Uterus:  {exam; uterus:12215}              Adnexa: {exam; adnexa:12223}               Rectovaginal: Confirms               Anus:  normal sphincter tone, no lesions  Chaperone was present for exam.  A:  Well Woman with normal exam  P:   {plan; gyn:5269::"mammogram","pap smear","return annually or prn"}

## 2016-11-03 ENCOUNTER — Other Ambulatory Visit: Payer: Self-pay | Admitting: Obstetrics & Gynecology

## 2016-11-05 NOTE — Telephone Encounter (Signed)
Medication refill request: Vitamin D 50000 Last AEX:  08/30/15 SM Next AEX: 11/16/16  Last MMG (if hormonal medication request): 09/29/15 BIRADS 2 benign Refill authorized: 08/31/15 #12 w/4 refills; today please advise

## 2016-11-15 NOTE — Progress Notes (Signed)
46 y.o. G2P2 Married Caucasian F here for annual exam.  Doing well.  Denies vaginal bleeding.    Patient's last menstrual period was 09/12/2014 (approximate).          Sexually active: Yes.    The current method of family planning is vasectomy, Mirena IUD placed 07/29/14    Exercising: Yes.    walking, hiking Smoker:  no  Health Maintenance: Pap:  06/28/14 negative.   05/2012 Neg. HR HPV:neg   History of abnormal Pap:  yes MMG:  09/29/15 BIRADS 2 benign. Pt knows she is due. Colonoscopy:  never BMD:   Never TDaP:  2011 Pneumonia vaccine(s):  No Zostavax:   No Hep C testing: not indicated  Screening Labs: No   reports that she has never smoked. She has never used smokeless tobacco. She reports that she drinks alcohol. She reports that she does not use drugs.  Past Medical History:  Diagnosis Date  . Ankle fracture   . Arthritis    bilateral knee  . Hiatal hernia   . IBS (irritable bowel syndrome)   . Kidney stone     Past Surgical History:  Procedure Laterality Date  . CESAREAN SECTION  2001, 2004   x2  . CYSTOSCOPY/RETROGRADE/URETEROSCOPY/STONE EXTRACTION WITH BASKET  2001  . WISDOM TOOTH EXTRACTION      Current Outpatient Prescriptions  Medication Sig Dispense Refill  . glucosamine-chondroitin 500-400 MG tablet Take 1 tablet by mouth 3 (three) times daily.    . Multiple Vitamins-Minerals (MULTIVITAMIN PO) Take by mouth daily.    Marland Kitchen PARoxetine (PAXIL) 20 MG tablet Take 1 tablet (20 mg total) by mouth daily. 90 tablet 4  . Vitamin D, Ergocalciferol, (DRISDOL) 50000 units CAPS capsule TAKE 1 CAPSULE BY MOUTH EVERY 7 DAYS. 12 capsule 0   No current facility-administered medications for this visit.     Family History  Problem Relation Age of Onset  . Breast cancer Paternal Aunt   . Hypertension Father   . Hypertension Paternal Grandfather   . CVA Paternal Grandfather   . Colon cancer Paternal Grandmother     ROS:  Pertinent items are noted in HPI.  Otherwise, a  comprehensive ROS was negative.  Exam:   BP 126/80 (BP Location: Right Arm, Patient Position: Sitting, Cuff Size: Large)   Pulse 90   Resp 18   Ht 5' 2.25" (1.581 m)   Wt 223 lb (101.2 kg)   LMP 09/12/2014 (Approximate)   BMI 40.46 kg/m     Height: 5' 2.25" (158.1 cm)  Ht Readings from Last 3 Encounters:  11/16/16 5' 2.25" (1.581 m)  01/30/16 5\' 2"  (1.575 m)  08/30/15 5' 2.5" (1.588 m)    General appearance: alert, cooperative and appears stated age Head: Normocephalic, without obvious abnormality, atraumatic Neck: no adenopathy, supple, symmetrical, trachea midline and thyroid normal to inspection and palpation Lungs: clear to auscultation bilaterally Breasts: normal appearance, no masses or tenderness Heart: regular rate and rhythm Abdomen: soft, non-tender; bowel sounds normal; no masses,  no organomegaly Extremities: extremities normal, atraumatic, no cyanosis or edema Skin: Skin color, texture, turgor normal. No rashes or lesions Lymph nodes: Cervical, supraclavicular, and axillary nodes normal. No abnormal inguinal nodes palpated Neurologic: Grossly normal   Pelvic: External genitalia:  no lesions              Urethra:  normal appearing urethra with no masses, tenderness or lesions              Bartholins and Skenes:  normal                 Vagina: normal appearing vagina with normal color and discharge, no lesions              Cervix: no lesions and IUD string noted              Pap taken: Yes.   Bimanual Exam:  Uterus:  normal size, contour, position, consistency, mobility, non-tender              Adnexa: normal adnexa and no mass, fullness, tenderness               Rectovaginal: Confirms               Anus:  normal sphincter tone, no lesions  Chaperone was present for exam.  A:  Well Woman with normal exam Amenorrhea with Mirena IUD placed 07/29/14 Fatty liver disease H/o enlarged spleen on ultrasound  P:   Mammogram guidelines reviewed.  Pt aware she is  due. pap smear and HR HPV obtained today Vit D obtained today Vit D rx fo 50K weekly #12/4RF to pharmacy Paxil 20mg  daily.  #90/4RF return annually or prn

## 2016-11-16 ENCOUNTER — Ambulatory Visit (INDEPENDENT_AMBULATORY_CARE_PROVIDER_SITE_OTHER): Payer: Managed Care, Other (non HMO) | Admitting: Obstetrics & Gynecology

## 2016-11-16 ENCOUNTER — Encounter: Payer: Self-pay | Admitting: Obstetrics & Gynecology

## 2016-11-16 ENCOUNTER — Other Ambulatory Visit (HOSPITAL_COMMUNITY)
Admission: RE | Admit: 2016-11-16 | Discharge: 2016-11-16 | Disposition: A | Discharge status: Home / Self Care | Payer: Managed Care, Other (non HMO) | Source: Ambulatory Visit | Attending: Obstetrics & Gynecology | Admitting: Obstetrics & Gynecology

## 2016-11-16 VITALS — BP 126/80 | HR 90 | Resp 18 | Ht 62.25 in | Wt 223.0 lb

## 2016-11-16 DIAGNOSIS — E559 Vitamin D deficiency, unspecified: Secondary | ICD-10-CM

## 2016-11-16 DIAGNOSIS — Z124 Encounter for screening for malignant neoplasm of cervix: Secondary | ICD-10-CM

## 2016-11-16 DIAGNOSIS — Z01419 Encounter for gynecological examination (general) (routine) without abnormal findings: Secondary | ICD-10-CM

## 2016-11-16 MED ORDER — PAROXETINE HCL 20 MG PO TABS: 20.0000 mg | ORAL_TABLET | Freq: Every day | ORAL | 4 refills | Status: DC

## 2016-11-16 MED ORDER — VITAMIN D (ERGOCALCIFEROL) 1.25 MG (50000 UNIT) PO CAPS: 50000.0000 [IU] | ORAL_CAPSULE | ORAL | 4 refills | Status: DC

## 2016-11-17 LAB — VITAMIN D 25 HYDROXY (VIT D DEFICIENCY, FRACTURES): Vit D, 25-Hydroxy: 23.7 ng/mL — ABNORMAL LOW (ref 30.0–100.0)

## 2016-11-19 LAB — CYTOLOGY - PAP
Diagnosis: NEGATIVE
HPV: NOT DETECTED

## 2016-11-23 ENCOUNTER — Other Ambulatory Visit: Payer: Self-pay | Admitting: Obstetrics & Gynecology

## 2016-11-23 NOTE — Telephone Encounter (Signed)
eScribe request from [facility] for refill on PAROXETINE Last filled - 11/16/16, #90 X 4 RF Last AEX - 11/16/16 Next AEX - 01/28/18  RX denied as new RX sent 11/16/16 at annual exam. Closing encounter.

## 2016-12-07 ENCOUNTER — Ambulatory Visit: Payer: Managed Care, Other (non HMO) | Admitting: Obstetrics & Gynecology

## 2017-01-29 ENCOUNTER — Other Ambulatory Visit: Payer: Self-pay | Admitting: Obstetrics & Gynecology

## 2017-05-20 ENCOUNTER — Other Ambulatory Visit (INDEPENDENT_AMBULATORY_CARE_PROVIDER_SITE_OTHER): Payer: Managed Care, Other (non HMO)

## 2017-05-20 ENCOUNTER — Encounter: Payer: Self-pay | Admitting: Internal Medicine

## 2017-05-20 ENCOUNTER — Ambulatory Visit: Payer: Managed Care, Other (non HMO) | Admitting: Internal Medicine

## 2017-05-20 DIAGNOSIS — R3129 Other microscopic hematuria: Secondary | ICD-10-CM | POA: Diagnosis not present

## 2017-05-20 DIAGNOSIS — R109 Unspecified abdominal pain: Secondary | ICD-10-CM

## 2017-05-20 LAB — CBC WITH DIFFERENTIAL/PLATELET
Basophils Absolute: 0 10*3/uL (ref 0.0–0.1)
Basophils Relative: 0.5 % (ref 0.0–3.0)
Eosinophils Absolute: 0.1 10*3/uL (ref 0.0–0.7)
Eosinophils Relative: 1.5 % (ref 0.0–5.0)
HCT: 40.5 % (ref 36.0–46.0)
Hemoglobin: 13.2 g/dL (ref 12.0–15.0)
Lymphocytes Relative: 43.3 % (ref 12.0–46.0)
Lymphs Abs: 3.1 10*3/uL (ref 0.7–4.0)
MCHC: 32.6 g/dL (ref 30.0–36.0)
MCV: 90.6 fl (ref 78.0–100.0)
Monocytes Absolute: 0.4 10*3/uL (ref 0.1–1.0)
Monocytes Relative: 5.4 % (ref 3.0–12.0)
Neutro Abs: 3.5 10*3/uL (ref 1.4–7.7)
Neutrophils Relative %: 49.3 % (ref 43.0–77.0)
Platelets: 310 10*3/uL (ref 150.0–400.0)
RBC: 4.47 Mil/uL (ref 3.87–5.11)
RDW: 13.3 % (ref 11.5–15.5)
WBC: 7.1 10*3/uL (ref 4.0–10.5)

## 2017-05-20 LAB — URINALYSIS, ROUTINE W REFLEX MICROSCOPIC
Ketones, ur: NEGATIVE
Nitrite: NEGATIVE
Specific Gravity, Urine: >=1.03 — AB (ref 1.000–1.030)
Urine Glucose: NEGATIVE
Urobilinogen, UA: 0.2 (ref 0.0–1.0)
pH: 5.5 (ref 5.0–8.0)

## 2017-05-20 LAB — BASIC METABOLIC PANEL
BUN: 13 mg/dL (ref 6–23)
CO2: 26 mEq/L (ref 19–32)
Calcium: 9.4 mg/dL (ref 8.4–10.5)
Chloride: 103 mEq/L (ref 96–112)
Creatinine, Ser: 0.77 mg/dL (ref 0.40–1.20)
GFR: 85.15 mL/min (ref >60.00)
Glucose, Bld: 109 mg/dL — ABNORMAL HIGH (ref 70–99)
Potassium: 3.6 mEq/L (ref 3.5–5.1)
Sodium: 138 mEq/L (ref 135–145)

## 2017-05-20 LAB — SEDIMENTATION RATE: Sed Rate: 25 mm/hr — ABNORMAL HIGH (ref 0–20)

## 2017-05-20 MED ORDER — CEFUROXIME AXETIL 500 MG PO TABS: ORAL_TABLET | ORAL | 0 refills | Status: DC

## 2017-05-20 NOTE — Assessment & Plan Note (Signed)
Acute pain in the R flank and blood in the urine. UA showed blood in the UA (no gross hematuria). No fever, had chills, sweats over the weekend. H/o surgery by Dr Diona Fanti 18 y ago...  UA abd [place] F/u w/Dr Sharlet Salina Ceftin if fever

## 2017-05-20 NOTE — Progress Notes (Signed)
Subjective:  Patient ID: Dorothy Armstrong, female    DOB: 1970-09-24  Age: 47 y.o. MRN: [id number]  CC: No chief complaint on file.   HPI Dorothy Armstrong presents for pain in the R flank and blood in the urine per [facility] on Sat. UA showed blood in the UA (no gross hematuria). No fever, had chills, sweats over the weekend. Threw up on Sat. H/o surgery by Dr Diona Fanti 18 y ago...  Outpatient Medications Prior to Visit  Medication Sig Dispense Refill  . glucosamine-chondroitin 500-400 MG tablet Take 1 tablet by mouth 3 (three) times daily.    . Multiple Vitamins-Minerals (MULTIVITAMIN PO) Take by mouth daily.    Marland Kitchen PARoxetine (PAXIL) 20 MG tablet Take 1 tablet (20 mg total) by mouth daily. 90 tablet 4  . Vitamin D, Ergocalciferol, (DRISDOL) 50000 units CAPS capsule Take 1 capsule (50,000 Units total) by mouth every 7 (seven) days. 12 capsule 4   No facility-administered medications prior to visit.     ROS Review of Systems  Constitutional: Positive for chills and diaphoresis. Negative for activity change, appetite change, fatigue, fever and unexpected weight change.  HENT: Negative for congestion, mouth sores and sinus pressure.   Eyes: Negative for visual disturbance.  Respiratory: Negative for cough and chest tightness.   Gastrointestinal: Negative for abdominal pain and nausea.  Genitourinary: Negative for difficulty urinating, frequency and vaginal pain.  Musculoskeletal: Positive for back pain. Negative for gait problem.  Skin: Negative for pallor and rash.  Neurological: Negative for dizziness, tremors, weakness, numbness and headaches.  Psychiatric/Behavioral: Negative for confusion and sleep disturbance.    Objective:  BP 118/82 (BP Location: Left Arm, Patient Position: Sitting, Cuff Size: Large)   Pulse 62   Temp 97.9 F (36.6 C) (Oral)   Ht 5' 2.25" (1.581 m)   Wt 224 lb (101.6 kg)   SpO2 99%   BMI 40.64 kg/m   BP Readings from Last 3 Encounters:  05/20/17  118/82  11/16/16 126/80  01/30/16 120/70    Wt Readings from Last 3 Encounters:  05/20/17 224 lb (101.6 kg)  11/16/16 223 lb (101.2 kg)  01/30/16 226 lb (102.5 kg)    Physical Exam  Constitutional: She appears well-developed. No distress.  HENT:  Head: Normocephalic.  Right Ear: External ear normal.  Left Ear: External ear normal.  Nose: Nose normal.  Mouth/Throat: Oropharynx is clear and moist.  Eyes: Conjunctivae are normal. Pupils are equal, round, and reactive to light. Right eye exhibits no discharge. Left eye exhibits no discharge.  Neck: Normal range of motion. Neck supple. No JVD present. No tracheal deviation present. No thyromegaly present.  Cardiovascular: Normal rate, regular rhythm and normal heart sounds.  Pulmonary/Chest: No stridor. No respiratory distress. She has no wheezes.  Abdominal: Soft. Bowel sounds are normal. She exhibits no distension and no mass. There is no tenderness. There is no rebound and no guarding.  Musculoskeletal: She exhibits no edema or tenderness.  Lymphadenopathy:    She has no cervical adenopathy.  Neurological: She displays normal reflexes. No cranial nerve deficit. She exhibits normal muscle tone. Coordination normal.  Skin: No rash noted. No erythema.  Psychiatric: She has a normal mood and affect. Her behavior is normal. Judgment and thought content normal.    Lab Results  Component Value Date   WBC 8.0 11/08/2014   HGB 12.1 08/30/2015   HCT 36.5 11/08/2014   PLT 258.0 11/08/2014   GLUCOSE 88 08/30/2015   CHOL 149 08/30/2015  TRIG 128 08/30/2015   HDL 43 (L) 08/30/2015   LDLCALC 80 08/30/2015   ALT 14 08/30/2015   AST 17 08/30/2015   NA 135 08/30/2015   K 3.7 08/30/2015   CL 102 08/30/2015   CREATININE 0.70 08/30/2015   BUN 10 08/30/2015   CO2 25 08/30/2015   TSH 1.413 06/28/2014    No results found.  Assessment & Plan:   There are no diagnoses linked to this encounter. I am having Dorothy Armstrong maintain her  Multiple Vitamins-Minerals (MULTIVITAMIN PO), glucosamine-chondroitin, PARoxetine, and Vitamin D (Ergocalciferol).  No orders of the defined types were placed in this encounter.    Follow-up: No Follow-up on file.  Walker Kehr, MD

## 2017-05-20 NOTE — Patient Instructions (Signed)
Dr Sharlet Salina f/u  Go to ER if worse

## 2017-05-27 ENCOUNTER — Encounter: Payer: Self-pay | Admitting: Internal Medicine

## 2017-05-27 ENCOUNTER — Ambulatory Visit: Payer: Managed Care, Other (non HMO) | Admitting: Internal Medicine

## 2017-05-27 DIAGNOSIS — R3129 Other microscopic hematuria: Secondary | ICD-10-CM

## 2017-05-27 DIAGNOSIS — R109 Unspecified abdominal pain: Secondary | ICD-10-CM

## 2017-05-27 NOTE — Assessment & Plan Note (Signed)
Resolved and could have been related to UTI or musculoskeletal as well. She did not feel it was typical for her past kidney stones.

## 2017-05-27 NOTE — Progress Notes (Signed)
   Subjective:    Patient ID: Dorothy Armstrong, female    DOB: 11-Feb-1971, 47 y.o.   MRN: [id number]  HPI The patient is a 47 YO female coming in for recheck of flank pain and hematuria. She was treated for a UTI after U/A in the office. She has been taking ceftin since that time and has felt 100% better. She denies any back or flank pian. Denies fevers or chills. She denies blood in the urine since last Wednesday. She is still taking another 1-2 days of antibiotics. Denies fevers or chills. She is also not having nausea and no more vomiting since last week.   Review of Systems  Constitutional: Negative.   HENT: Negative.   Eyes: Negative.   Respiratory: Negative for cough, chest tightness and shortness of breath.   Cardiovascular: Negative for chest pain, palpitations and leg swelling.  Gastrointestinal: Negative for abdominal distention, abdominal pain, constipation, diarrhea, nausea and vomiting.  Musculoskeletal: Negative.   Skin: Negative.   Neurological: Negative.   Psychiatric/Behavioral: Negative.       Objective:   Physical Exam  Constitutional: She is oriented to person, place, and time. She appears well-developed and well-nourished.  HENT:  Head: Normocephalic and atraumatic.  Eyes: EOM are normal.  Neck: Normal range of motion.  Cardiovascular: Normal rate and regular rhythm.  Pulmonary/Chest: Effort normal and breath sounds normal. No respiratory distress. She has no wheezes. She has no rales.  Abdominal: Soft. Bowel sounds are normal. She exhibits no distension and no mass. There is no tenderness. There is no rebound and no guarding.  Musculoskeletal: She exhibits no edema.  Neurological: She is alert and oriented to person, place, and time. Coordination normal.  Skin: Skin is warm and dry.  Psychiatric: She has a normal mood and affect.   Vitals:   05/27/17 0858  BP: 114/78  Pulse: 71  Temp: 97.8 F (36.6 C)  TempSrc: Oral  SpO2: 99%  Weight: 225 lb (102.1 kg)    Height: 5' 2.25" (1.581 m)     Assessment & Plan:

## 2017-05-27 NOTE — Progress Notes (Deleted)
Subjective:     Patient ID: Dorothy Armstrong, female   DOB: [birth year], 47 y.o.   MRN: [id number]  HPI   Review of Systems     Objective:   Physical Exam     Assessment:     ***    Plan:     ***

## 2017-05-27 NOTE — Patient Instructions (Signed)
We will have you finish the antibiotics and next time you're in for a physical we will recheck the urine.

## 2017-05-27 NOTE — Assessment & Plan Note (Signed)
Likely associated with the UTI and she is now treated and feeling improved. She will need U/A with next physical to rule out hematuria ongoing. Since still on antibiotics will not recheck today. Advised that she does not need [place] stomach for now and she is okay with that.

## 2017-06-11 ENCOUNTER — Encounter: Payer: Self-pay | Admitting: Internal Medicine

## 2017-09-11 ENCOUNTER — Telehealth: Payer: Self-pay

## 2017-09-11 NOTE — Telephone Encounter (Signed)
Copied from [place] [id number]. Topic: Referral - Request >> Sep 11, 2017 10:49 AM Scherrie Gerlach wrote: Reason for CRM: pt states she was having the same pain in her right side as she did in Jan. Pt never did the IMG524 - US ABDOMEN COMPLETE That was ordered 1/07 because the pain got better.  But pt had the pain again yesterday, at least 24 hrs.  No pain today. Called to see if Dr will do referral again.  Pt is not having any UA symptoms or blood in the urine this time. Advised pt if she needs appt, someone will call her to schedule.

## 2017-09-12 NOTE — Telephone Encounter (Signed)
Can you please make patient an office visit. Thank you  

## 2017-09-12 NOTE — Telephone Encounter (Signed)
Would recommend visit

## 2017-09-16 NOTE — Telephone Encounter (Signed)
Appt made

## 2017-09-19 ENCOUNTER — Ambulatory Visit: Payer: Managed Care, Other (non HMO) | Admitting: Internal Medicine

## 2017-09-19 ENCOUNTER — Encounter: Payer: Self-pay | Admitting: Internal Medicine

## 2017-09-19 DIAGNOSIS — R1011 Right upper quadrant pain: Secondary | ICD-10-CM

## 2017-09-19 LAB — POCT URINALYSIS DIPSTICK
Bilirubin, UA: NEGATIVE
Blood, UA: NEGATIVE
Glucose, UA: NEGATIVE
Ketones, UA: NEGATIVE
Leukocytes, UA: NEGATIVE
Nitrite, UA: NEGATIVE
Protein, UA: NEGATIVE
Spec Grav, UA: 1.015 (ref 1.010–1.025)
Urobilinogen, UA: 0.2 E.U./dL
pH, UA: 6 (ref 5.0–8.0)

## 2017-09-19 NOTE — Progress Notes (Signed)
   Subjective:    Patient ID: Dorothy Armstrong, female    DOB: 12/02/1970, 47 y.o.   MRN: [id number]  HPI The patient is a 47 YO female coming in for recurrent right flank pain. Had an episode in January which had blood in urine as well as possible UTI. Treated with antibiotics and resolved. She then had recurrence of the symptoms about 4 days ago. She was throwing up and right sided abdomen pain. This lasted for hours. She did not seek care for this at the time. Now she is feeling normal but wants to know why this keeps happening.   Review of Systems  Constitutional: Negative.   HENT: Negative.   Eyes: Negative.   Respiratory: Negative for cough, chest tightness and shortness of breath.   Cardiovascular: Negative for chest pain, palpitations and leg swelling.  Gastrointestinal: Positive for abdominal pain, nausea and vomiting. Negative for abdominal distention, constipation and diarrhea.  Musculoskeletal: Negative.   Skin: Negative.   Neurological: Negative.   Psychiatric/Behavioral: Negative.       Objective:   Physical Exam  Constitutional: She is oriented to person, place, and time. She appears well-developed and well-nourished.  HENT:  Head: Normocephalic and atraumatic.  Eyes: EOM are normal.  Neck: Normal range of motion.  Cardiovascular: Normal rate and regular rhythm.  Pulmonary/Chest: Effort normal and breath sounds normal. No respiratory distress. She has no wheezes. She has no rales.  Abdominal: Soft. Bowel sounds are normal. She exhibits no distension. There is no tenderness. There is no rebound.  Musculoskeletal: She exhibits no edema.  Neurological: She is alert and oriented to person, place, and time. Coordination normal.  Skin: Skin is warm and dry.   Vitals:   09/19/17 1456  BP: 116/80  Pulse: 82  Temp: 97.9 F (36.6 C)  TempSrc: Oral  SpO2: 98%  Weight: 228 lb (103.4 kg)  Height: 5' 2.25" (1.581 m)      Assessment & Plan:

## 2017-09-19 NOTE — Patient Instructions (Signed)
There is no blood in the urine today but we will go ahead and get the CT scan of the stomach to see if there are any problems with the gallbladder or new kidney stones.

## 2017-09-20 DIAGNOSIS — R1011 Right upper quadrant pain: Secondary | ICD-10-CM | POA: Insufficient documentation

## 2017-09-20 NOTE — Assessment & Plan Note (Addendum)
Suspect recurrent stones given history. No hematuria today on POC in the office. Checking CT for stones and evaluate gallbladder as well. US abdomen 3 years ago without gallbladder stones or problems.

## 2017-10-01 ENCOUNTER — Inpatient Hospital Stay: Admission: RE | Admit: 2017-10-01 | Payer: Managed Care, Other (non HMO) | Source: Ambulatory Visit

## 2017-10-15 ENCOUNTER — Encounter: Payer: Self-pay | Admitting: Internal Medicine

## 2017-10-22 ENCOUNTER — Encounter: Payer: Self-pay | Admitting: Obstetrics & Gynecology

## 2018-01-28 ENCOUNTER — Encounter: Payer: Self-pay | Admitting: Obstetrics & Gynecology

## 2018-01-28 ENCOUNTER — Other Ambulatory Visit: Payer: Self-pay

## 2018-01-28 ENCOUNTER — Ambulatory Visit: Payer: Managed Care, Other (non HMO) | Admitting: Obstetrics & Gynecology

## 2018-01-28 VITALS — BP 116/80 | HR 76 | Resp 16 | Ht 62.0 in | Wt 216.8 lb

## 2018-01-28 DIAGNOSIS — Z0000 Encounter for general adult medical examination without abnormal findings: Secondary | ICD-10-CM

## 2018-01-28 DIAGNOSIS — R899 Unspecified abnormal finding in specimens from other organs, systems and tissues: Secondary | ICD-10-CM

## 2018-01-28 DIAGNOSIS — E559 Vitamin D deficiency, unspecified: Secondary | ICD-10-CM | POA: Diagnosis not present

## 2018-01-28 DIAGNOSIS — Z1211 Encounter for screening for malignant neoplasm of colon: Secondary | ICD-10-CM

## 2018-01-28 DIAGNOSIS — Z01419 Encounter for gynecological examination (general) (routine) without abnormal findings: Secondary | ICD-10-CM | POA: Diagnosis not present

## 2018-01-28 MED ORDER — PAROXETINE HCL 20 MG PO TABS: 20.0000 mg | ORAL_TABLET | Freq: Every day | ORAL | 4 refills | Status: DC

## 2018-01-28 NOTE — Progress Notes (Signed)
47 y.o. G2P2 Married White or Caucasian female here for annual exam.  Doing well.  Having minimal bleeding.  Pleases with IUD.  Patient's last menstrual period was 07/13/2014 (approximate).          Sexually active: Yes.    The current method of family planning is IUD.  Mirena placed 07/29/14.   Exercising: No.  8-10K steps a day  Smoker:  no  Health Maintenance: Pap:  11/16/16 Neg. HR HPV:neg   06/28/14 Neg   History of abnormal Pap:  yes MMG:  10/09/17 BIRADS2:Benign. F/u 1 year  Colonoscopy:  Never.  ACS guidelines reviewed.   BMD:   Never TDaP:  2011 Screening Labs: PCP   reports that she has never smoked. She has never used smokeless tobacco. She reports that she drinks about 1.0 standard drinks of alcohol per week. She reports that she does not use drugs.  Past Medical History:  Diagnosis Date  . Ankle fracture   . Arthritis    bilateral knee  . Chronic kidney disease    kidney stone  . Hiatal hernia   . IBS (irritable bowel syndrome)     Past Surgical History:  Procedure Laterality Date  . CESAREAN SECTION  2001, 2004   x2  . CYSTOSCOPY/RETROGRADE/URETEROSCOPY/STONE EXTRACTION WITH BASKET  2001  . WISDOM TOOTH EXTRACTION      Current Outpatient Medications  Medication Sig Dispense Refill  . glucosamine-chondroitin 500-400 MG tablet Take 1 tablet by mouth 3 (three) times daily.    . Multiple Vitamins-Minerals (MULTIVITAMIN PO) Take by mouth daily.    Marland Kitchen PARoxetine (PAXIL) 20 MG tablet Take 1 tablet (20 mg total) by mouth daily. 90 tablet 4  . Vitamin D, Ergocalciferol, (DRISDOL) 50000 units CAPS capsule Take 1 capsule (50,000 Units total) by mouth every 7 (seven) days. 12 capsule 4   No current facility-administered medications for this visit.     Family History  Problem Relation Age of Onset  . Breast cancer Paternal Aunt   . Hypertension Father   . Hypertension Paternal Grandfather   . CVA Paternal Grandfather   . Colon cancer Paternal Grandmother      Review of Systems  Musculoskeletal: Positive for back pain (right low).  All other systems reviewed and are negative.   Exam:   BP 116/80 (BP Location: Right Arm, Patient Position: Sitting, Cuff Size: Large)   Pulse 76   Resp 16   Ht 5\' 2"  (1.575 m)   Wt 216 lb 12.8 oz (98.3 kg)   LMP 07/13/2014 (Approximate)   BMI 39.65 kg/m    Height: 5\' 2"  (157.5 cm)  Ht Readings from Last 3 Encounters:  01/28/18 5\' 2"  (1.575 m)  09/19/17 5' 2.25" (1.581 m)  05/27/17 5' 2.25" (1.581 m)    General appearance: alert, cooperative and appears stated age Head: Normocephalic, without obvious abnormality, atraumatic Neck: no adenopathy, supple, symmetrical, trachea midline and thyroid normal to inspection and palpation Lungs: clear to auscultation bilaterally Breasts: normal appearance, no masses or tenderness Heart: regular rate and rhythm Abdomen: soft, non-tender; bowel sounds normal; no masses,  no organomegaly Extremities: extremities normal, atraumatic, no cyanosis or edema Skin: Skin color, texture, turgor normal. No rashes or lesions Lymph nodes: Cervical, supraclavicular, and axillary nodes normal. No abnormal inguinal nodes palpated Neurologic: Grossly normal   Pelvic: External genitalia:  no lesions              Urethra:  normal appearing urethra with no masses, tenderness or lesions  Bartholins and Skenes: normal                 Vagina: normal appearing vagina with normal color and discharge, no lesions              Cervix: no lesions and IUD string noted              Pap taken: No. Bimanual Exam:  Uterus:  normal size, contour, position, consistency, mobility, non-tender              Adnexa: normal adnexa and no mass, fullness, tenderness               Rectovaginal: Confirms               Anus:  normal sphincter tone, no lesions  Chaperone was present for exam.  A:  Well Woman with normal exam Minimal bleeding with Mirena IUD placed 07/29/14 Fatty liver  disease H/o enlarged spleen on ultrasound H/O elevated sed rate earlier this year  P:   Mammogram guidelines reviewed.   pap smear with neg HR HPV 2018.  No pap smear obtained today. Continue Paxil 20mg  daily.  Rx to pharmacy. IFOB ordered.  New American Cancer screening guidelines for colorectal cancer reviewed. Sed rate, TSH and Vit D obtained today Return annually or prn

## 2018-01-29 ENCOUNTER — Telehealth: Payer: Self-pay

## 2018-01-29 LAB — VITAMIN D 25 HYDROXY (VIT D DEFICIENCY, FRACTURES): Vit D, 25-Hydroxy: 19.6 ng/mL — ABNORMAL LOW (ref 30.0–100.0)

## 2018-01-29 LAB — TSH: TSH: 1.8 u[IU]/mL (ref 0.450–4.500)

## 2018-01-29 LAB — SEDIMENTATION RATE: Sed Rate: 18 mm/hr (ref 0–32)

## 2018-01-29 NOTE — Telephone Encounter (Signed)
Copied from [place] [id number]. Topic: General - Other >> Jan 29, 2018 10:37 AM Judyann Munson wrote: Reason for CRM:  [facility] to advise they are needing Order to fax to  [phone]. Patient appt is schedule for tomorrow at 3 pm.   Best contact number: [phone]

## 2018-01-30 ENCOUNTER — Other Ambulatory Visit: Payer: Self-pay | Admitting: *Deleted

## 2018-01-30 ENCOUNTER — Encounter: Payer: Self-pay | Admitting: Internal Medicine

## 2018-01-30 DIAGNOSIS — E559 Vitamin D deficiency, unspecified: Secondary | ICD-10-CM

## 2018-01-30 MED ORDER — VITAMIN D (ERGOCALCIFEROL) 1.25 MG (50000 UNIT) PO CAPS: 50000.0000 [IU] | ORAL_CAPSULE | ORAL | 0 refills | Status: DC

## 2018-01-30 NOTE — Telephone Encounter (Signed)
CT stone study abdomen and pelvis without

## 2018-01-30 NOTE — Telephone Encounter (Signed)
Order is already in epic so fine to sign papers when they arrive.

## 2018-01-30 NOTE — Telephone Encounter (Signed)
refaxed the previous orders to [facility]

## 2018-01-30 NOTE — Telephone Encounter (Signed)
Did we get any information about what they are asking [place] to order?

## 2018-01-31 ENCOUNTER — Encounter: Payer: Self-pay | Admitting: Obstetrics & Gynecology

## 2018-03-28 ENCOUNTER — Telehealth: Payer: Self-pay | Admitting: Internal Medicine

## 2018-03-28 NOTE — Telephone Encounter (Signed)
It was normal with some slight thickening in the bladder which can be seen in bladder infection. If she is having symptoms she should come in for visit as this was several months ago.

## 2018-03-28 NOTE — Telephone Encounter (Signed)
Pt called requesting results from CT scan she had in September.  Please call pt at [phone]

## 2018-03-28 NOTE — Telephone Encounter (Signed)
Patient informed of CT results and stated understanding. Patient is not having symptoms now just was wondering what the results were

## 2018-04-23 ENCOUNTER — Other Ambulatory Visit: Payer: Self-pay | Admitting: Obstetrics & Gynecology

## 2018-05-05 ENCOUNTER — Other Ambulatory Visit (INDEPENDENT_AMBULATORY_CARE_PROVIDER_SITE_OTHER): Payer: Managed Care, Other (non HMO)

## 2018-05-05 DIAGNOSIS — E559 Vitamin D deficiency, unspecified: Secondary | ICD-10-CM

## 2018-05-06 LAB — VITAMIN D 25 HYDROXY (VIT D DEFICIENCY, FRACTURES): Vit D, 25-Hydroxy: 28.9 ng/mL — ABNORMAL LOW (ref 30.0–100.0)

## 2018-07-01 ENCOUNTER — Encounter: Payer: Self-pay | Admitting: Internal Medicine

## 2018-07-01 ENCOUNTER — Ambulatory Visit: Payer: Managed Care, Other (non HMO) | Admitting: Internal Medicine

## 2018-07-01 ENCOUNTER — Ambulatory Visit (INDEPENDENT_AMBULATORY_CARE_PROVIDER_SITE_OTHER)
Admission: RE | Admit: 2018-07-01 | Discharge: 2018-07-01 | Disposition: A | Discharge status: Home / Self Care | Payer: Managed Care, Other (non HMO) | Source: Ambulatory Visit | Attending: Internal Medicine | Admitting: Internal Medicine

## 2018-07-01 DIAGNOSIS — M5442 Lumbago with sciatica, left side: Secondary | ICD-10-CM | POA: Diagnosis not present

## 2018-07-01 DIAGNOSIS — G8929 Other chronic pain: Secondary | ICD-10-CM | POA: Insufficient documentation

## 2018-07-01 DIAGNOSIS — M545 Low back pain: Secondary | ICD-10-CM

## 2018-07-01 IMAGING — DX DG LUMBAR SPINE COMPLETE 4+V
5 series · 5 of 5 positions shown · non-contrast
Comparison: None.

CLINICAL DATA: Left low back pain and left leg pain, 6 weeks
duration.

EXAM:
LUMBAR SPINE - COMPLETE 4+ VIEW

[l-spine ap]
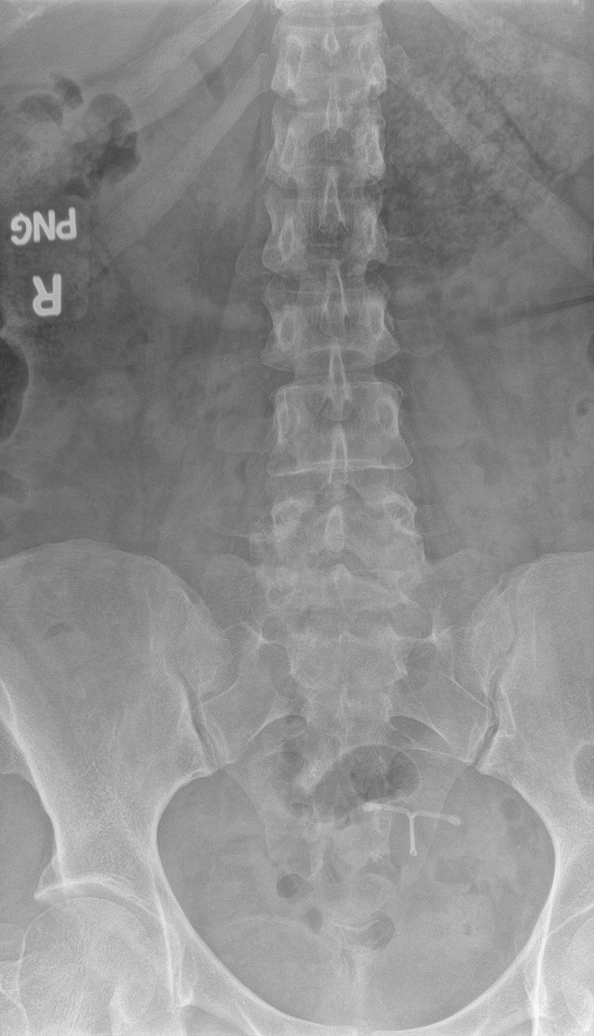

[l-spine obl (1 of 2)]
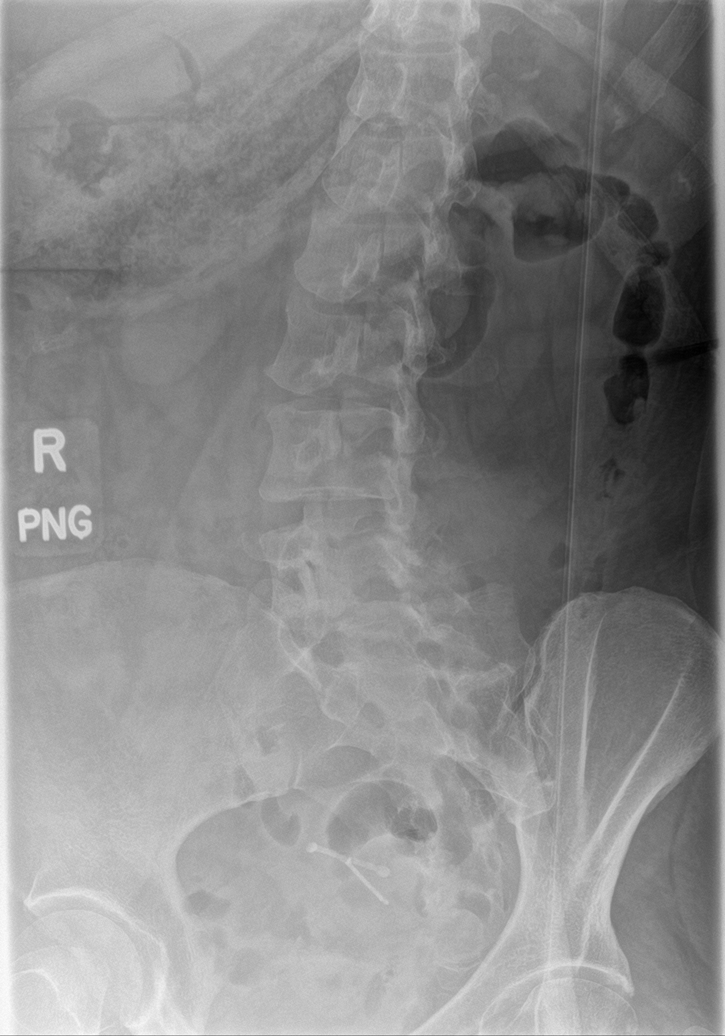

[l-spine obl (2 of 2)]
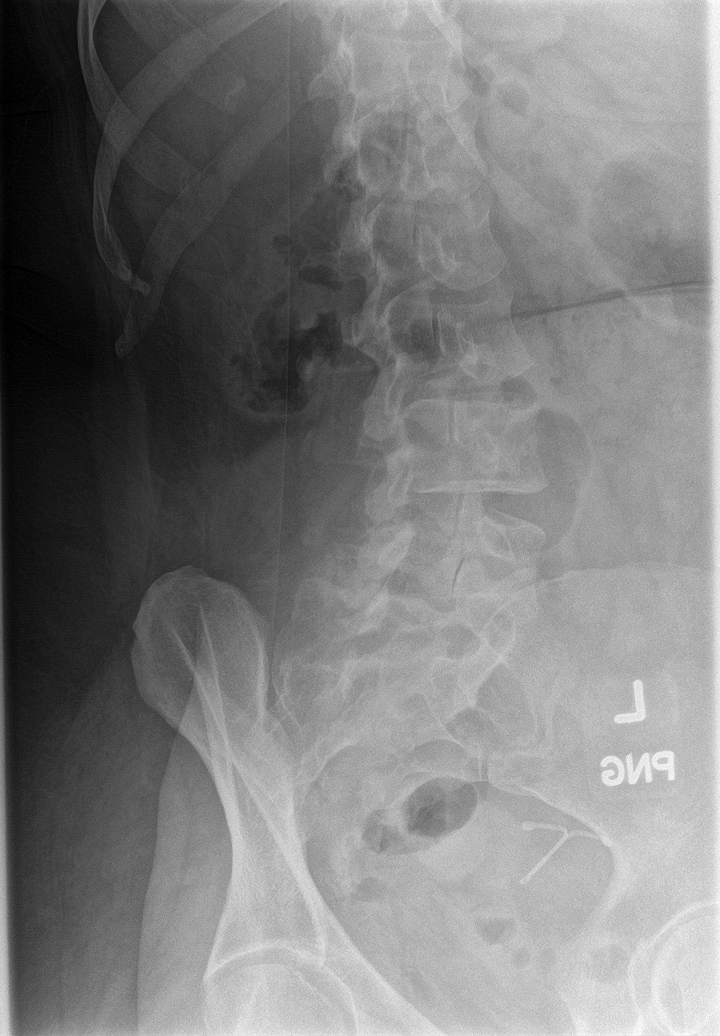

[l-spine lat]
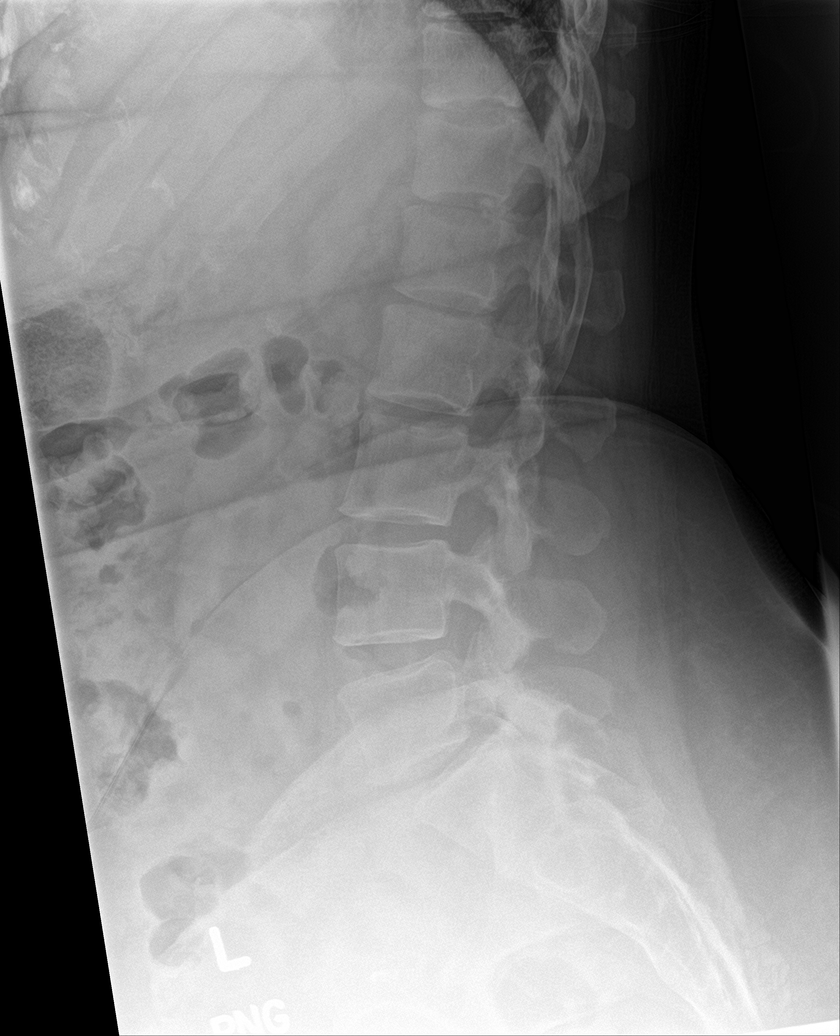

[l-spine spot]
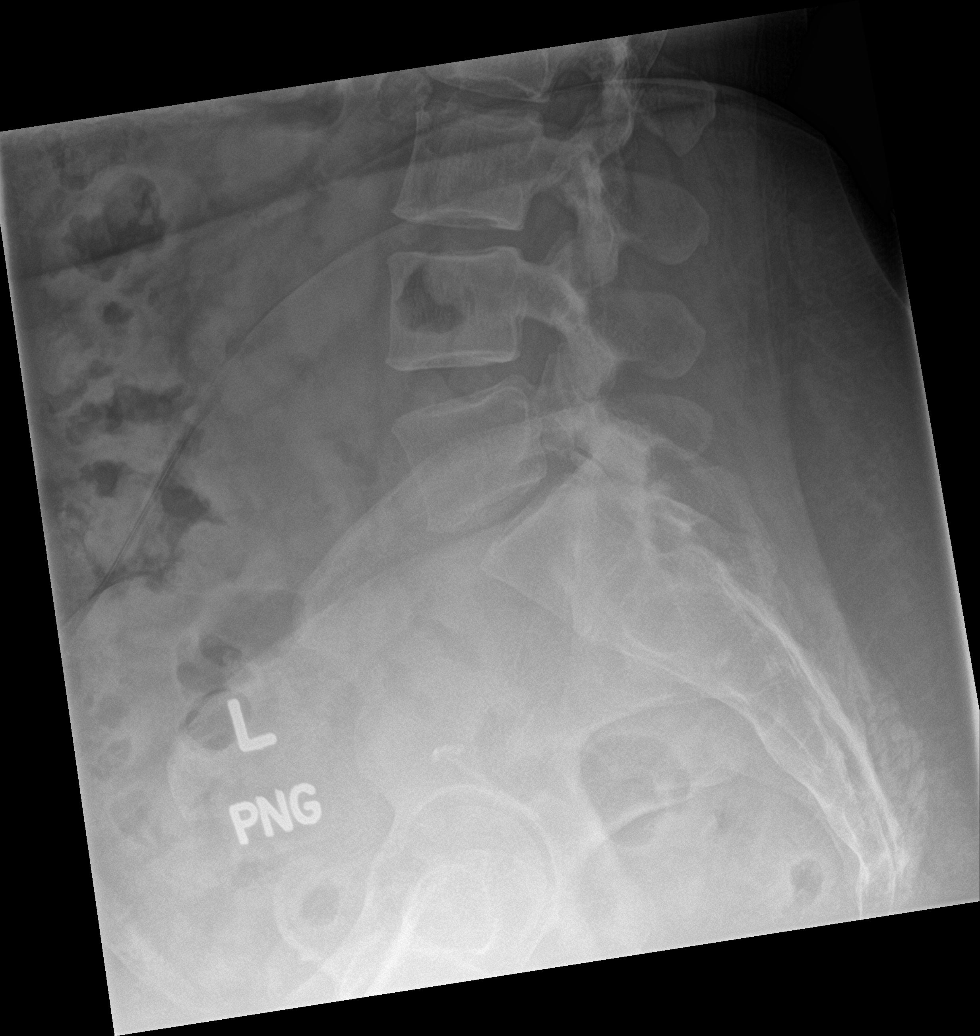

[5 of 5 positions shown; findings below may reference images not displayed]

FINDINGS: Five lumbar type vertebral bodies show normal alignment at L4-5 and
above, with 13 mm of anterolisthesis at L5-S1. Disc space narrowing
L5-S1. Lower lumbar facet arthropathy, most pronounced at L5-S1. No
evidence of pars defect. No fracture or focal lesion otherwise. IUD
present within the central pelvis.
IMPRESSION: Degenerative anterolisthesis at L5-S1 of 13 mm. L5-S1 disc space
narrowing. Findings at this level could certainly be painful.

## 2018-07-01 MED ORDER — METHYLPREDNISOLONE ACETATE 40 MG/ML IJ SUSP
40.0000 mg | Freq: Once | INTRAMUSCULAR | Status: AC
  Administered 2018-07-01: 40 mg via INTRAMUSCULAR

## 2018-07-01 NOTE — Assessment & Plan Note (Signed)
Checking x-ray lumbar given length of symptoms and given depo-medrol 40 mg IM at visit.

## 2018-07-01 NOTE — Patient Instructions (Addendum)
We have given you a shot today which should help within 2-3 days with the back.   We are also checking an x-ray of the low back.   If this does not help call us back and we can send in something for the pain to use at night time.

## 2018-07-01 NOTE — Progress Notes (Signed)
   Subjective:   Patient ID: Dorothy Armstrong, female    DOB: 11-03-1970, 48 y.o.   MRN: [id number]  HPI The patient is a 48 YO female coming in for low back pain. Started about 6 weeks ago. Has been taking ibuprofen for the pain. The pain is only present at night time with positioning and in the morning before she gets moving. Lasts less than 1 hour in the morning. She has tried heat and ibuprofen which help some. She has tried some exercises which make the pain and stiffness last less time in the morning. She denies numbness or weakness in her leg.   Review of Systems  Constitutional: Positive for activity change. Negative for appetite change, chills, fatigue, fever and unexpected weight change.  Respiratory: Negative.   Cardiovascular: Negative.   Gastrointestinal: Negative.   Musculoskeletal: Positive for arthralgias, back pain and myalgias. Negative for gait problem and joint swelling.  Skin: Negative.   Neurological: Negative.     Objective:  Physical Exam Constitutional:      Appearance: She is well-developed.  HENT:     Head: Normocephalic and atraumatic.  Neck:     Musculoskeletal: Normal range of motion.  Cardiovascular:     Rate and Rhythm: Normal rate and regular rhythm.  Pulmonary:     Effort: Pulmonary effort is normal. No respiratory distress.     Breath sounds: Normal breath sounds. No wheezing or rales.  Abdominal:     General: Bowel sounds are normal. There is no distension.     Palpations: Abdomen is soft.     Tenderness: There is no abdominal tenderness. There is no rebound.  Musculoskeletal:        General: Tenderness present.     Comments: No pain on exam but location of pain is lumbar midline and left SI region  Skin:    General: Skin is warm and dry.  Neurological:     Mental Status: She is alert and oriented to person, place, and time.     Coordination: Coordination normal.     Vitals:   07/01/18 1457  BP: 130/80  Pulse: 73  Temp: 98.3 F (36.8  C)  TempSrc: Oral  SpO2: 98%  Weight: 225 lb (102.1 kg)  Height: 5\' 2"  (1.575 m)    Assessment & Plan:  Depo-medrol 40 mg IM given at visit

## 2018-07-04 ENCOUNTER — Telehealth: Payer: Self-pay | Admitting: Internal Medicine

## 2018-07-04 ENCOUNTER — Telehealth: Payer: Self-pay

## 2018-07-04 DIAGNOSIS — M5442 Lumbago with sciatica, left side: Secondary | ICD-10-CM

## 2018-07-04 MED ORDER — CYCLOBENZAPRINE HCL 5 MG PO TABS: 5.0000 mg | ORAL_TABLET | Freq: Every evening | ORAL | 1 refills | Status: DC | PRN

## 2018-07-04 NOTE — Addendum Note (Signed)
Addended by: Pricilla Holm A on: 07/04/2018 11:11 AM   Modules accepted: Orders

## 2018-07-04 NOTE — Telephone Encounter (Signed)
Referral placed.

## 2018-07-04 NOTE — Telephone Encounter (Signed)
Copied from [place] [id number]. Topic: Quick Communication - See Telephone Encounter >> Jul 04, 2018 10:07 AM Sheran Luz wrote: CRM for notification. See Telephone encounter for: 07/04/18.  Patient states that she was advised to contact office if she felt steroid injection she received on 07/01/2018 was not helping with her pain. Patient states that the relief has been minimal and she inquired if a pain medication can be sent in to pharmacy. Please advise.  Pharmacy: [facility], [place]  [phone] (Phone) [phone] (Fax)

## 2018-07-04 NOTE — Telephone Encounter (Signed)
Patient informed of MD response  

## 2018-07-04 NOTE — Telephone Encounter (Signed)
Sent in flexeril which she can take at night time. Can continue with heat and ibuprofen and stretching as well.

## 2018-07-04 NOTE — Telephone Encounter (Signed)
Copied from [place] [id number]. Topic: Referral - Request for Referral >> Jul 04, 2018 10:02 AM Sheran Luz wrote: Has patient seen PCP for this complaint? Yes.   Referral for which specialty: Orthopedics  Preferred provider/office: Laurence Spates with [facility]  Reason for referral: back pain

## 2018-07-04 NOTE — Telephone Encounter (Signed)
Patient informed referral was placed  

## 2018-07-16 ENCOUNTER — Ambulatory Visit (INDEPENDENT_AMBULATORY_CARE_PROVIDER_SITE_OTHER): Payer: Managed Care, Other (non HMO) | Admitting: Physical Medicine and Rehabilitation

## 2018-07-16 ENCOUNTER — Encounter (INDEPENDENT_AMBULATORY_CARE_PROVIDER_SITE_OTHER): Payer: Self-pay | Admitting: Physical Medicine and Rehabilitation

## 2018-07-16 VITALS — BP 125/84 | HR 70 | Ht 62.0 in | Wt 215.0 lb

## 2018-07-16 DIAGNOSIS — M47816 Spondylosis without myelopathy or radiculopathy, lumbar region: Secondary | ICD-10-CM

## 2018-07-16 DIAGNOSIS — G8929 Other chronic pain: Secondary | ICD-10-CM | POA: Diagnosis not present

## 2018-07-16 DIAGNOSIS — M4316 Spondylolisthesis, lumbar region: Secondary | ICD-10-CM | POA: Diagnosis not present

## 2018-07-16 DIAGNOSIS — M5442 Lumbago with sciatica, left side: Secondary | ICD-10-CM

## 2018-07-16 MED ORDER — PREDNISONE 50 MG PO TABS: ORAL_TABLET | ORAL | 0 refills | Status: DC

## 2018-07-16 MED ORDER — MELOXICAM 15 MG PO TABS: 15.0000 mg | ORAL_TABLET | Freq: Every day | ORAL | 0 refills | Status: DC

## 2018-07-16 NOTE — Progress Notes (Signed)
.  Numeric Pain Rating Scale and Functional Assessment Average Pain 8 Pain Right Now 1 My pain is intermittent, dull and aching Pain is worse with: sleeping Pain improves with: heat/ice   In the last MONTH (on 0-10 scale) has pain interfered with the following?  1. General activity like being  able to carry out your everyday physical activities such as walking, climbing stairs, carrying groceries, or moving a chair?  Rating(7)  2. Relation with others like being able to carry out your usual social activities and roles such as  activities at home, at work and in your community. Rating(4)  3. Enjoyment of life such that you have  been bothered by emotional problems such as feeling anxious, depressed or irritable?  Rating(0)

## 2018-07-17 ENCOUNTER — Encounter (INDEPENDENT_AMBULATORY_CARE_PROVIDER_SITE_OTHER): Payer: Self-pay | Admitting: Physical Medicine and Rehabilitation

## 2018-07-17 NOTE — Progress Notes (Signed)
DANIJA GOSA - 48 y.o. female MRN [id number]  Date of birth: 04/07/1971  Office Visit Note: Visit Date: 07/16/2018 PCP: Hoyt Koch, MD Referred by: Hoyt Koch, *  Subjective: Chief Complaint  Patient presents with  . Lower Back - Pain  . Left Thigh - Pain  . Left Hip - Pain   HPI: Dorothy Armstrong is a 48 y.o. female who comes in today At the request of her primary care physician Dr. Pricilla Holm for evaluation management of worsening chronic history of low back pain but now several months of left low back and hip and thigh pain.  I actually know Tian and her husband quite well and we initially talked about her case through her husband and it sounds like it is been really a nagging pain for her.  She reports to me today that sometime in the beginning of January she had acute onset of left low back pain referring into the left lateral hip and thigh.  The pain is never gone past the knee and there is no numbness or tingling.  She reports most of her pain is trying to sleep at night and that during the day she actually gets along okay when she gets moving.  She does not really report increased pain with prolonged standing although sometimes she has some back pain.  She gets a lot of symptoms first thing in the morning and is stiff with a lot of pain until she gets moving.  She has not had any right-sided symptoms.  She is not had any focal weakness.  She reports the pain is intermittent dull and aching.  In the office right now in the afternoon her pain is only at 1.  She feels like at night in the morning it can be as high as an 8 out of 10.  It does limit her daily activities when she is trying to do things with going up and down stairs at times.  Her primary care physician treated her with cyclobenzaprine as well as an intramuscular injection of 40 mg of Depo-Medrol.  Patient reports it did not seem to help that much and the Flexeril does not really seem to help that much.   She has not had physical therapy or chiropractic care.  She has had no prior spine surgery or no prior spine trauma.  She reports a history of a kidney stone in the past and this pain is much different than that.  Again she reports it is more of a dull achy sensation.  Primary care physician did obtain lumbar spine x-rays which are reviewed below and reviewed with the patient and did reveal spondylolisthesis of L5 on S1 with facet arthropathy.  Patient has not had CT scan or MRI.  Review of Systems  Constitutional: Negative for chills, fever, malaise/fatigue and weight loss.  HENT: Negative for hearing loss and sinus pain.   Eyes: Negative for blurred vision, double vision and photophobia.  Respiratory: Negative for cough and shortness of breath.   Cardiovascular: Negative for chest pain, palpitations and leg swelling.  Gastrointestinal: Negative for abdominal pain, nausea and vomiting.  Genitourinary: Negative for flank pain.  Musculoskeletal: Positive for back pain and joint pain. Negative for myalgias.  Skin: Negative for itching and rash.  Neurological: Negative for tremors, focal weakness and weakness.  Endo/Heme/Allergies: Negative.   Psychiatric/Behavioral: Negative for depression.  All other systems reviewed and are negative.  Otherwise per HPI.  Assessment & Plan: Visit Diagnoses:  1. Chronic left-sided low back pain with left-sided sciatica   2. Spondylolisthesis of lumbar region   3. Spondylosis without myelopathy or radiculopathy, lumbar region     Plan: Findings:  Chronic history of off-and-on back pain now with 8 weeks of fairly severe left-sided low back pain referring into the thigh worse in the morning and worse at night.  Seems to be a dull aching type pain.  I really feel clinically this is related to facet arthropathy which is fairly significant at L5-S1 producing a grade 1 but fairly significant listhesis of L5 on S1.  The rest of her spine looks very good with minimal  degenerative changes other than some mild rotational scoliosis which was not shown on the report.  It is somewhat hard to see but I do agree I do not see any pars defect or spondylolysis.  We spent time with spine models and imaging showing her of the issue with the listhesis.  I think the next step with her is to try a little bit of increased anti-inflammatory medication with 50 mg of prednisone for a few days followed by meloxicam for a couple of weeks.  We gave her some idea of activity modification and exercises to do.  We did talk about weight loss.  She has started on intermittent fasting diet which I think is fair.  She is not getting much relief at that point I would look at either diagnostic facet joint block or MRI of the lumbar spine.  Concern would be potential for small disc protrusion coupled with the listhesis giving her some nerve root irritation versus facet arthropathy in general.  I doubt that there is central canal narrowing but without a pars defect and that level of listhesis there could be some central canal narrowing.  Exam is fairly benign however.    Meds & Orders:  Meds ordered this encounter  Medications  . predniSONE (DELTASONE) 50 MG tablet    Sig: Take 1 tablet daily with food for 5 days until finished    Dispense:  5 tablet    Refill:  0  . meloxicam (MOBIC) 15 MG tablet    Sig: Take 1 tablet (15 mg total) by mouth daily. Take with food, start after Prednisone finished.    Dispense:  30 tablet    Refill:  0   No orders of the defined types were placed in this encounter.   Follow-up: Return if symptoms worsen or fail to improve, for Consider left L5-S1 facet joint block versus MRI or physical therapy.   Procedures: No procedures performed  No notes on file   Clinical History: LUMBAR SPINE - COMPLETE 4+ VIEW  COMPARISON:  None.  FINDINGS: Five lumbar type vertebral bodies show normal alignment at L4-5 and above, with 13 mm of anterolisthesis at L5-S1. Disc  space narrowing L5-S1. Lower lumbar facet arthropathy, most pronounced at L5-S1. No evidence of pars defect. No fracture or focal lesion otherwise. IUD present within the central pelvis.  IMPRESSION: Degenerative anterolisthesis at L5-S1 of 13 mm. L5-S1 disc space narrowing. Findings at this level could certainly be painful.   Electronically Signed   By: Nelson Chimes M.D.   On: 07/02/2018 09:23   She reports that she has never smoked. She has never used smokeless tobacco. No results for input(s): HGBA1C, LABURIC in the last 8760 hours.  Objective:  VS:  HT:5\' 2"  (157.5 cm)   WT:215 lb (97.5 kg)  BMI:39.31    BP:125/84  HR:70bpm  TEMP: ( )  RESP:  Physical Exam Vitals signs and nursing note reviewed.  Constitutional:      General: She is not in acute distress.    Appearance: Normal appearance. She is well-developed. She is obese.  HENT:     Head: Normocephalic and atraumatic.     Nose: Nose normal.     Mouth/Throat:     Mouth: Mucous membranes are moist.     Pharynx: Oropharynx is clear.  Eyes:     Conjunctiva/sclera: Conjunctivae normal.     Pupils: Pupils are equal, round, and reactive to light.  Neck:     Musculoskeletal: Normal range of motion and neck supple.  Cardiovascular:     Rate and Rhythm: Regular rhythm.  Pulmonary:     Effort: Pulmonary effort is normal. No respiratory distress.  Abdominal:     General: There is no distension.     Palpations: Abdomen is soft.     Tenderness: There is no guarding.  Musculoskeletal:     Right lower leg: No edema.     Left lower leg: No edema.     Comments: Patient stands slightly forward flexed with increased lordosis.  She does have tenderness and taut bands in the quadratus lumborum and paraspinal region.  No pain over the greater trochanter or PSIS.  No pain with hip rotation internal or external.  She has good strength in the lower extremities bilaterally without deficit.  She does have pain with facet joint loading  with extension of the lumbar spine  Skin:    General: Skin is warm and dry.     Findings: No erythema or rash.  Neurological:     General: No focal deficit present.     Mental Status: She is alert and oriented to person, place, and time.     Motor: No abnormal muscle tone.     Coordination: Coordination normal.     Gait: Gait normal.     Deep Tendon Reflexes: Reflexes normal.     Comments: Patient has brisk reflexes at the patella and ankles bilaterally but symmetric.  No clonus bilaterally.  Negative slump test bilaterally.  Psychiatric:        Mood and Affect: Mood normal.        Behavior: Behavior normal.        Thought Content: Thought content normal.     Ortho Exam Imaging: No results found.  Past Medical/Family/Surgical/Social History: Medications & Allergies reviewed per EMR, new medications updated. Patient Active Problem List   Diagnosis Date Noted  . Low back pain 07/01/2018  . Right upper quadrant abdominal pain 09/20/2017  . Routine general medical examination at a health care facility 01/30/2016  . IUD (intrauterine device) in place 08/30/2015  . Fatty liver disease, nonalcoholic 62/13/0865  . Obesity 11/08/2014  . Splenomegaly 11/08/2014  . Menorrhagia with regular cycle 08/01/2014  . OSTEOARTHRITIS 02/05/2008  . NEPHROLITHIASIS, HX OF 02/05/2008   Past Medical History:  Diagnosis Date  . Ankle fracture   . Arthritis    bilateral knee  . Chronic kidney disease    kidney stone  . Hiatal hernia   . IBS (irritable bowel syndrome)    Family History  Problem Relation Age of Onset  . Breast cancer Paternal Aunt   . Hypertension Father   . Hypertension Paternal Grandfather   . CVA Paternal Grandfather   . Colon cancer Paternal Grandmother    Past Surgical History:  Procedure Laterality Date  . CESAREAN SECTION  2001,  2004   x2  . CYSTOSCOPY/RETROGRADE/URETEROSCOPY/STONE EXTRACTION WITH BASKET  2001  . WISDOM TOOTH EXTRACTION     Social History    Occupational History  . Not on file  Tobacco Use  . Smoking status: Never Smoker  . Smokeless tobacco: Never Used  Substance and Sexual Activity  . Alcohol use: Yes    Alcohol/week: 1.0 standard drinks    Types: 1 Glasses of wine per week  . Drug use: No  . Sexual activity: Yes    Partners: Male    Birth control/protection: Other-see comments, I.U.D.    Comment: vasectomy

## 2018-07-18 ENCOUNTER — Encounter (INDEPENDENT_AMBULATORY_CARE_PROVIDER_SITE_OTHER): Payer: Self-pay | Admitting: Physical Medicine and Rehabilitation

## 2018-08-12 ENCOUNTER — Telehealth (INDEPENDENT_AMBULATORY_CARE_PROVIDER_SITE_OTHER): Payer: Self-pay | Admitting: Physical Medicine and Rehabilitation

## 2018-08-13 ENCOUNTER — Encounter (INDEPENDENT_AMBULATORY_CARE_PROVIDER_SITE_OTHER): Payer: Self-pay | Admitting: Physical Medicine and Rehabilitation

## 2018-08-13 ENCOUNTER — Ambulatory Visit (INDEPENDENT_AMBULATORY_CARE_PROVIDER_SITE_OTHER): Payer: Managed Care, Other (non HMO) | Admitting: Physical Medicine and Rehabilitation

## 2018-08-13 ENCOUNTER — Other Ambulatory Visit: Payer: Self-pay

## 2018-08-13 DIAGNOSIS — M47816 Spondylosis without myelopathy or radiculopathy, lumbar region: Secondary | ICD-10-CM | POA: Diagnosis not present

## 2018-08-13 DIAGNOSIS — G8929 Other chronic pain: Secondary | ICD-10-CM

## 2018-08-13 DIAGNOSIS — M5442 Lumbago with sciatica, left side: Secondary | ICD-10-CM

## 2018-08-13 DIAGNOSIS — M4316 Spondylolisthesis, lumbar region: Secondary | ICD-10-CM | POA: Diagnosis not present

## 2018-08-13 MED ORDER — ACETAMINOPHEN-CODEINE #3 300-30 MG PO TABS: 1.0000 | ORAL_TABLET | Freq: Four times a day (QID) | ORAL | 0 refills | Status: AC | PRN

## 2018-08-13 MED ORDER — PREDNISONE 50 MG PO TABS: ORAL_TABLET | ORAL | 0 refills | Status: DC

## 2018-08-13 NOTE — Telephone Encounter (Signed)
Scheduled for virtual visit via [email or web address] at 130 today.

## 2018-08-13 NOTE — Telephone Encounter (Signed)
Ok thanks 

## 2018-08-13 NOTE — Progress Notes (Signed)
   Numeric Pain Rating Scale and Functional Assessment Average Pain 5 Pain Right Now 2 My pain is intermittent, sharp and aching Pain is worse with: bending, standing and first thing in morning Pain improves with: heat/ice, therapy/exercise and medication   In the last MONTH (on 0-10 scale) has pain interfered with the following?  1. General activity like being  able to carry out your everyday physical activities such as walking, climbing stairs, carrying groceries, or moving a chair?  Rating(2)  2. Relation with others like being able to carry out your usual social activities and roles such as  activities at home, at work and in your community. Rating(2)  3. Enjoyment of life such that you have  been bothered by emotional problems such as feeling anxious, depressed or irritable?  Rating(2)

## 2018-08-13 NOTE — Telephone Encounter (Signed)
Willing to repeat prednisone short course but would be good if able to do OV

## 2018-08-14 ENCOUNTER — Encounter (INDEPENDENT_AMBULATORY_CARE_PROVIDER_SITE_OTHER): Payer: Self-pay | Admitting: Physical Medicine and Rehabilitation

## 2018-08-14 NOTE — Progress Notes (Signed)
Dorothy Armstrong - 48 y.o. female MRN [id number]  Date of birth: 1971/05/07  Virtual Visit via Video Note  I connected with Dorothy Armstrong on 08/13/2018 at  1:30 PM EDT by a video enabled telemedicine application and verified that I am speaking with the correct person using two identifiers.   I discussed the limitations of evaluation and management by telemedicine and the availability of in person appointments. The patient expressed understanding and agreed to proceed.  Visit Date: 08/13/2018 PCP: Hoyt Koch, MD Referred by: Hoyt Koch, *  Subjective: Chief Complaint  Patient presents with   Lower Back - Pain   HPI: Dorothy Armstrong is a 48 y.o. female. We saw the patient a few weeks ago and she has had chronic persistent left-sided low back and buttock type pain some posterior lateral pain particularly worse in the morning and better in the afternoon with walking and movement.  She reports that she lays down any length of time and then tries to get up is painful.  We gave her a short course of prednisone and it was pretty miraculous and the fact that it really alleviated all her symptoms but then her symptoms returned.  She has been taking some meloxicam and that has helped some but the symptoms did return.  She has had no changes in the symptoms no right-sided complaints no paresthesias no focal weakness no trauma.  X-ray images revealed by history of degenerative facet arthritis at L5-S1 with grade 1 listhesis.  Some foraminal narrowing.  She reports overall she is better than when she first saw [place] but it still problematic particularly in the morning.  She reports practicing social distancing at this point not really getting out of the house very much except for parents.  Review of Systems  Musculoskeletal: Positive for back pain and joint pain.  Neurological: Negative for tingling, sensory change and focal weakness.   Otherwise per  HPI.  Objective/Observation:  Observation includes patient moving quite normally with no problems with ambulation and no focal weakness.  She has some pain with extension rotation.  Some pain laying on the left side.  Assessment & Plan: Visit Diagnoses:  1. Chronic left-sided low back pain with left-sided sciatica   2. Spondylolisthesis of lumbar region   3. Spondylosis without myelopathy or radiculopathy, lumbar region     Plan: Findings:  Continued persistent left-sided low back and buttock pain worse first thing in the morning better with activity and in the afternoon.  It is some level of facet arthritic pain and perhaps nerve root pain at the L5-S1 level.  Prednisone seemed to help her quite a bit but symptoms did return although not as bad as in the past.  Ordinarily I would probably at this point look at injection of the facet joint or perhaps epidural at least one time to see if it made a big difference.  Unfortunately having to put these off at this point given the coronavirus situation.  She really is doing better so has not a severe issue.  We also talked about the need probably at some point for MRI of the lumbar spine and I think that would be wise to get if her symptoms are persistent.  At this point I do want to go ahead and do 1 more short course of prednisone to make this a 3-day course just to see if its additive to the first course.  I do not think there is going to be  any issue with lowering her immune system with 3 days of this and she is staying at home pretty much.  When I have her stop the meloxicam at this point.  I want to try her on a small dose of Tylenol 3 temporarily just to see if that is enough on bad days to get her over the symptoms in the morning.  She has tolerated these medications in the past without issue.  We did review the controlled substance list and she is not taking any other controlled substance.  We will see her back in a few weeks or she will call us in a  few weeks and we will see how she is doing.  At that point would make a decision on injection versus maybe MRI versus focused physical therapy.  I did give her some exercises to do and look up including dead bug exercises as well as hamstring stretching.  Interestingly she does not appear to have bilateral pars defects.    Meds & Orders:  Meds ordered this encounter  Medications   predniSONE (DELTASONE) 50 MG tablet    Sig: Take 1 tablet daily with food for 3 days until finished    Dispense:  3 tablet    Refill:  0   acetaminophen-codeine (TYLENOL #3) 300-30 MG tablet    Sig: Take 1 tablet by mouth every 6 (six) hours as needed for up to 5 days for moderate pain.    Dispense:  20 tablet    Refill:  0   No orders of the defined types were placed in this encounter.   Follow-up: Return if symptoms worsen or fail to improve.  Follow Up Instructions:    I discussed the assessment and treatment plan with the patient. The patient was provided an opportunity to ask questions and all were answered. The patient agreed with the plan and demonstrated an understanding of the instructions.   The patient was advised to call back or seek an in-person evaluation if the symptoms worsen or if the condition fails to improve as anticipated.  I provided 15 minutes of non-face-to-face time during this encounter.   Laurence Spates, MD    Clinical History: LUMBAR SPINE - COMPLETE 4+ VIEW  COMPARISON:  None.  FINDINGS: Five lumbar type vertebral bodies show normal alignment at L4-5 and above, with 13 mm of anterolisthesis at L5-S1. Disc space narrowing L5-S1. Lower lumbar facet arthropathy, most pronounced at L5-S1. No evidence of pars defect. No fracture or focal lesion otherwise. IUD present within the central pelvis.  IMPRESSION: Degenerative anterolisthesis at L5-S1 of 13 mm. L5-S1 disc space narrowing. Findings at this level could certainly be painful.   Electronically Signed   By: Nelson Chimes M.D.   On: 07/02/2018 09:23   She reports that she has never smoked. She has never used smokeless tobacco. No results for input(s): HGBA1C, LABURIC in the last 8760 hours.  Past Medical/Family/Surgical/Social History: Medications & Allergies reviewed per EMR, new medications updated. Patient Active Problem List   Diagnosis Date Noted   Low back pain 07/01/2018   Right upper quadrant abdominal pain 09/20/2017   Routine general medical examination at a health care facility 01/30/2016   IUD (intrauterine device) in place 08/30/2015   Fatty liver disease, nonalcoholic 24/58/0998   Obesity 11/08/2014   Splenomegaly 11/08/2014   Menorrhagia with regular cycle 08/01/2014   OSTEOARTHRITIS 02/05/2008   NEPHROLITHIASIS, HX OF 02/05/2008   Past Medical History:  Diagnosis Date   Ankle fracture  Arthritis    bilateral knee   Chronic kidney disease    kidney stone   Hiatal hernia    IBS (irritable bowel syndrome)    Family History  Problem Relation Age of Onset   Breast cancer Paternal Aunt    Hypertension Father    Hypertension Paternal Grandfather    CVA Paternal Grandfather    Colon cancer Paternal Grandmother    Past Surgical History:  Procedure Laterality Date   CESAREAN SECTION  2001, 2004   x2   CYSTOSCOPY/RETROGRADE/URETEROSCOPY/STONE EXTRACTION WITH BASKET  2001   WISDOM TOOTH EXTRACTION     Social History   Occupational History   Not on file  Tobacco Use   Smoking status: Never Smoker   Smokeless tobacco: Never Used  Substance and Sexual Activity   Alcohol use: Yes    Alcohol/week: 1.0 standard drinks    Types: 1 Glasses of wine per week   Drug use: No   Sexual activity: Yes    Partners: Male    Birth control/protection: Other-see comments, I.U.D.    Comment: vasectomy

## 2018-09-18 ENCOUNTER — Telehealth: Payer: Self-pay | Admitting: Physical Medicine and Rehabilitation

## 2018-09-18 NOTE — Telephone Encounter (Signed)
Left L5-S1 facet block, she has listhesis, she texted me today, try to get her in somewhere sooner than later, friend

## 2018-09-18 NOTE — Telephone Encounter (Signed)
Service [phone] Authorization PTYYPE:[id number] Auth Effective Date:05/07/2020Auth End Date:11/03/2020Initiated Date:05/07/2020Decision Date:05/07/2020Decision Type :InitialCase Status:Approved

## 2018-09-18 NOTE — Telephone Encounter (Signed)
Can you check auth for this? If it is authorized we can put her in on Monday May 11 at 1530. We had a patient cancel.

## 2018-09-23 ENCOUNTER — Encounter: Payer: Self-pay | Admitting: Physical Medicine and Rehabilitation

## 2018-09-23 ENCOUNTER — Ambulatory Visit: Payer: Self-pay

## 2018-09-23 ENCOUNTER — Other Ambulatory Visit: Payer: Self-pay

## 2018-09-23 ENCOUNTER — Ambulatory Visit: Payer: Managed Care, Other (non HMO) | Admitting: Physical Medicine and Rehabilitation

## 2018-09-23 VITALS — BP 131/87 | HR 83

## 2018-09-23 DIAGNOSIS — M47816 Spondylosis without myelopathy or radiculopathy, lumbar region: Secondary | ICD-10-CM

## 2018-09-23 MED ORDER — METHYLPREDNISOLONE ACETATE 80 MG/ML IJ SUSP: 80.0000 mg | Freq: Once | INTRAMUSCULAR | Status: DC

## 2018-09-23 NOTE — Progress Notes (Signed)
 .  Numeric Pain Rating Scale and Functional Assessment Average Pain 8   In the last MONTH (on 0-10 scale) has pain interfered with the following?  1. General activity like being  able to carry out your everyday physical activities such as walking, climbing stairs, carrying groceries, or moving a chair?  Rating(7)   +Driver, -BT, -Dye Allergies.  

## 2018-09-29 NOTE — Procedures (Signed)
Lumbar Facet Joint Intra-Articular Injection(s) with Fluoroscopic Guidance  Patient: Dorothy Armstrong      Date of Birth: 01-Feb-1971 MRN: [id number] PCP: Hoyt Koch, MD      Visit Date: 09/23/2018   Universal Protocol:    Date/Time: 09/23/2018  Consent Given By: the patient  Position: PRONE   Additional Comments: Vital signs were monitored before and after the procedure. Patient was prepped and draped in the usual sterile fashion. The correct patient, procedure, and site was verified.   Injection Procedure Details:  Procedure Site One Meds Administered:  Meds ordered this encounter  Medications  . methylPREDNISolone acetate (DEPO-MEDROL) injection 80 mg     Laterality: Left  Location/Site:  L5-S1  Needle size: 22 guage  Needle type: Spinal  Needle Placement: Articular  Findings:  -Comments: Excellent flow of contrast producing a partial arthrogram.  Procedure Details: The fluoroscope beam is vertically oriented in AP, and the inferior recess is visualized beneath the lower pole of the inferior apophyseal process, which represents the target point for needle insertion. When direct visualization is difficult the target point is located at the medial projection of the vertebral pedicle. The region overlying each aforementioned target is locally anesthetized with a 1 to 2 ml. volume of 1% Lidocaine without Epinephrine.   The spinal needle was inserted into each of the above mentioned facet joints using biplanar fluoroscopic guidance. A 0.25 to 0.5 ml. volume of Isovue-250 was injected and a partial facet joint arthrogram was obtained. A single spot film was obtained of the resulting arthrogram.    One to 1.25 ml of the steroid/anesthetic solution was then injected into each of the facet joints noted above.   Additional Comments:  The patient tolerated the procedure well Dressing: 2 x 2 sterile gauze and Band-Aid    Post-procedure details: Patient was observed  during the procedure. Post-procedure instructions were reviewed.  Patient left the clinic in stable condition.

## 2018-09-29 NOTE — Progress Notes (Signed)
EARLY ORD - 48 y.o. female MRN [id number]  Date of birth: 1971/05/09  Office Visit Note: Visit Date: 09/23/2018 PCP: Hoyt Koch, MD Referred by: Hoyt Koch, *  Subjective: Chief Complaint  Patient presents with  . Lower Back - Pain  . Left Thigh - Pain   HPI:  Dorothy Armstrong is a 48 y.o. female who comes in today For planned left L5-S1 facet joint block diagnostically and therapeutically.  Please see our prior evaluation and management note for further details and justification.  If no benefit gained we will either look at epidural injection versus lumbar MRI versus physical therapy.  ROS Otherwise per HPI.  Assessment & Plan: Visit Diagnoses:  1. Spondylosis without myelopathy or radiculopathy, lumbar region     Plan: No additional findings.   Meds & Orders:  Meds ordered this encounter  Medications  . methylPREDNISolone acetate (DEPO-MEDROL) injection 80 mg    Orders Placed This Encounter  Procedures  . Facet Injection  . XR C-ARM NO REPORT    Follow-up: No follow-ups on file.   Procedures: No procedures performed  Lumbar Facet Joint Intra-Articular Injection(s) with Fluoroscopic Guidance  Patient: Dorothy Armstrong      Date of Birth: June 17, 1970 MRN: [id number] PCP: Hoyt Koch, MD      Visit Date: 09/23/2018   Universal Protocol:    Date/Time: 09/23/2018  Consent Given By: the patient  Position: PRONE   Additional Comments: Vital signs were monitored before and after the procedure. Patient was prepped and draped in the usual sterile fashion. The correct patient, procedure, and site was verified.   Injection Procedure Details:  Procedure Site One Meds Administered:  Meds ordered this encounter  Medications  . methylPREDNISolone acetate (DEPO-MEDROL) injection 80 mg     Laterality: Left  Location/Site:  L5-S1  Needle size: 22 guage  Needle type: Spinal  Needle Placement: Articular  Findings:  -Comments:  Excellent flow of contrast producing a partial arthrogram.  Procedure Details: The fluoroscope beam is vertically oriented in AP, and the inferior recess is visualized beneath the lower pole of the inferior apophyseal process, which represents the target point for needle insertion. When direct visualization is difficult the target point is located at the medial projection of the vertebral pedicle. The region overlying each aforementioned target is locally anesthetized with a 1 to 2 ml. volume of 1% Lidocaine without Epinephrine.   The spinal needle was inserted into each of the above mentioned facet joints using biplanar fluoroscopic guidance. A 0.25 to 0.5 ml. volume of Isovue-250 was injected and a partial facet joint arthrogram was obtained. A single spot film was obtained of the resulting arthrogram.    One to 1.25 ml of the steroid/anesthetic solution was then injected into each of the facet joints noted above.   Additional Comments:  The patient tolerated the procedure well Dressing: 2 x 2 sterile gauze and Band-Aid    Post-procedure details: Patient was observed during the procedure. Post-procedure instructions were reviewed.  Patient left the clinic in stable condition.    Clinical History: LUMBAR SPINE - COMPLETE 4+ VIEW  COMPARISON:  None.  FINDINGS: Five lumbar type vertebral bodies show normal alignment at L4-5 and above, with 13 mm of anterolisthesis at L5-S1. Disc space narrowing L5-S1. Lower lumbar facet arthropathy, most pronounced at L5-S1. No evidence of pars defect. No fracture or focal lesion otherwise. IUD present within the central pelvis.  IMPRESSION: Degenerative anterolisthesis at L5-S1 of 13 mm. L5-S1  disc space narrowing. Findings at this level could certainly be painful.   Electronically Signed   By: Nelson Chimes M.D.   On: 07/02/2018 09:23     Objective:  VS:  HT:    WT:   BMI:     BP:131/87  HR:83bpm  TEMP: ( )  RESP:  Physical Exam   Ortho Exam Imaging: No results found.

## 2018-10-14 ENCOUNTER — Telehealth: Payer: Self-pay | Admitting: Physical Medicine and Rehabilitation

## 2018-10-15 NOTE — Telephone Encounter (Signed)
Submitted PA for [id number], case is pending on [employer].

## 2018-10-23 NOTE — Telephone Encounter (Signed)
Pt is scheduled for 11/06/2018 with driver.

## 2018-11-06 ENCOUNTER — Ambulatory Visit (INDEPENDENT_AMBULATORY_CARE_PROVIDER_SITE_OTHER): Payer: Managed Care, Other (non HMO) | Admitting: Physical Medicine and Rehabilitation

## 2018-11-06 ENCOUNTER — Ambulatory Visit: Payer: Self-pay

## 2018-11-06 ENCOUNTER — Encounter: Payer: Self-pay | Admitting: Physical Medicine and Rehabilitation

## 2018-11-06 ENCOUNTER — Other Ambulatory Visit: Payer: Self-pay

## 2018-11-06 VITALS — BP 116/80 | HR 77

## 2018-11-06 DIAGNOSIS — M5416 Radiculopathy, lumbar region: Secondary | ICD-10-CM

## 2018-11-06 MED ORDER — BETAMETHASONE SOD PHOS & ACET 6 (3-3) MG/ML IJ SUSP
12.0000 mg | Freq: Once | INTRAMUSCULAR | Status: AC
  Administered 2018-11-06: 12 mg

## 2018-11-06 NOTE — Progress Notes (Signed)
Numeric Pain Rating Scale and Functional Assessment Average Pain 5   In the last MONTH (on 0-10 scale) has pain interfered with the following?  1. General activity like being  able to carry out your everyday physical activities such as walking, climbing stairs, carrying groceries, or moving a chair?  Rating(0)    +Driver, -BT, -Dye Allergies.

## 2018-11-06 NOTE — Procedures (Signed)
S1 Lumbosacral Transforaminal Epidural Steroid Injection - Sub-Pedicular Approach with Fluoroscopic Guidance   Patient: Dorothy Armstrong      Date of Birth: August 21, 1970 MRN: [id number] PCP: Hoyt Koch, MD      Visit Date: 11/06/2018   Universal Protocol:    Date/Time: 11/05/2009:40 AM  Consent Given By: the patient  Position:  PRONE  Additional Comments: Vital signs were monitored before and after the procedure. Patient was prepped and draped in the usual sterile fashion. The correct patient, procedure, and site was verified.   Injection Procedure Details:  Procedure Site One Meds Administered:  Meds ordered this encounter  Medications  . betamethasone acetate-betamethasone sodium phosphate (CELESTONE) injection 12 mg    Laterality: Left  Location/Site:  S1 Foramen   Needle size: 22 ga.  Needle type: Spinal  Needle Placement: Transforaminal  Findings:   -Comments: Excellent flow of contrast along the nerve and into the epidural space.  Procedure Details: After squaring off the sacral end-plate to get a true AP view, the C-arm was positioned so that the best possible view of the S1 foramen was visualized. The soft tissues overlying this structure were infiltrated with 2-3 ml. of 1% Lidocaine without Epinephrine.    The spinal needle was inserted toward the target using a "trajectory" view along the fluoroscope beam.  Under AP and lateral visualization, the needle was advanced so it did not puncture dura. Biplanar projections were used to confirm position. Aspiration was confirmed to be negative for CSF and/or blood. A 1-2 ml. volume of Isovue-250 was injected and flow of contrast was noted at each level. Radiographs were obtained for documentation purposes.   After attaining the desired flow of contrast documented above, a 0.5 to 1.0 ml test dose of 0.25% Marcaine was injected into each respective transforaminal space.  The patient was observed for 90 seconds  post injection.  After no sensory deficits were reported, and normal lower extremity motor function was noted,   the above injectate was administered so that equal amounts of the injectate were placed at each foramen (level) into the transforaminal epidural space.   Additional Comments:  The patient tolerated the procedure well Dressing: Band-Aid with 2 x 2 sterile gauze    Post-procedure details: Patient was observed during the procedure. Post-procedure instructions were reviewed.  Patient left the clinic in stable condition.

## 2018-11-06 NOTE — Progress Notes (Signed)
Dorothy Armstrong - 48 y.o. female MRN [id number]  Date of birth: Jun 17, 1970  Office Visit Note: Visit Date: 11/06/2018 PCP: Hoyt Koch, MD Referred by: Hoyt Koch, *  Subjective: No chief complaint on file.  HPI:  Dorothy Armstrong is a 48 y.o. female who comes in today For planned left S1 transforaminal epidural steroid injection.  Please see our prior notes for further details and justification.  Briefly she still has left-sided low back pain some referral posterior laterally into the thigh more of an L5 or S1 distribution and occasional tingling in the fifth toe on the left.  Prior facet joint block did not help much right at the beginning of the week after the injection but ultimately she has had some relief of her back pain but not any of the referral pain.  Again her pain is really severe first in the morning better with activity which she would think would be opposite for facet joint arthritis I do think there may be some nerve root irritation.  She has a listhesis L5 on S1 with transitional segment with somewhat lumbarized S1 segment.  Depending on relief may be look at MRI of the lumbar spine.  No red flag complaints.  She continues with home exercises including core strengthening and hamstring stretching.  We also talked today about neural flossing.  ROS Otherwise per HPI.  Assessment & Plan: Visit Diagnoses:  1. Lumbar radiculopathy     Plan: No additional findings.   Meds & Orders:  Meds ordered this encounter  Medications  . betamethasone acetate-betamethasone sodium phosphate (CELESTONE) injection 12 mg    Orders Placed This Encounter  Procedures  . XR C-ARM NO REPORT  . Epidural Steroid injection    Follow-up: Return if symptoms worsen or fail to improve.   Procedures: No procedures performed  S1 Lumbosacral Transforaminal Epidural Steroid Injection - Sub-Pedicular Approach with Fluoroscopic Guidance   Patient: Dorothy Armstrong      Date of Birth:  1970-08-03 MRN: [id number] PCP: Hoyt Koch, MD      Visit Date: 11/06/2018   Universal Protocol:    Date/Time: 11/05/2009:40 AM  Consent Given By: the patient  Position:  PRONE  Additional Comments: Vital signs were monitored before and after the procedure. Patient was prepped and draped in the usual sterile fashion. The correct patient, procedure, and site was verified.   Injection Procedure Details:  Procedure Site One Meds Administered:  Meds ordered this encounter  Medications  . betamethasone acetate-betamethasone sodium phosphate (CELESTONE) injection 12 mg    Laterality: Left  Location/Site:  S1 Foramen   Needle size: 22 ga.  Needle type: Spinal  Needle Placement: Transforaminal  Findings:   -Comments: Excellent flow of contrast along the nerve and into the epidural space.  Procedure Details: After squaring off the sacral end-plate to get a true AP view, the C-arm was positioned so that the best possible view of the S1 foramen was visualized. The soft tissues overlying this structure were infiltrated with 2-3 ml. of 1% Lidocaine without Epinephrine.    The spinal needle was inserted toward the target using a "trajectory" view along the fluoroscope beam.  Under AP and lateral visualization, the needle was advanced so it did not puncture dura. Biplanar projections were used to confirm position. Aspiration was confirmed to be negative for CSF and/or blood. A 1-2 ml. volume of Isovue-250 was injected and flow of contrast was noted at each level. Radiographs were obtained for documentation  purposes.   After attaining the desired flow of contrast documented above, a 0.5 to 1.0 ml test dose of 0.25% Marcaine was injected into each respective transforaminal space.  The patient was observed for 90 seconds post injection.  After no sensory deficits were reported, and normal lower extremity motor function was noted,   the above injectate was administered so that  equal amounts of the injectate were placed at each foramen (level) into the transforaminal epidural space.   Additional Comments:  The patient tolerated the procedure well Dressing: Band-Aid with 2 x 2 sterile gauze    Post-procedure details: Patient was observed during the procedure. Post-procedure instructions were reviewed.  Patient left the clinic in stable condition.   Clinical History: LUMBAR SPINE - COMPLETE 4+ VIEW  COMPARISON:  None.  FINDINGS: Five lumbar type vertebral bodies show normal alignment at L4-5 and above, with 13 mm of anterolisthesis at L5-S1. Disc space narrowing L5-S1. Lower lumbar facet arthropathy, most pronounced at L5-S1. No evidence of pars defect. No fracture or focal lesion otherwise. IUD present within the central pelvis.  IMPRESSION: Degenerative anterolisthesis at L5-S1 of 13 mm. L5-S1 disc space narrowing. Findings at this level could certainly be painful.   Electronically Signed   By: Nelson Chimes M.D.   On: 07/02/2018 09:23     Objective:  VS:  HT:    WT:   BMI:     BP:116/80  HR:77bpm  TEMP: ( )  RESP:98 % Physical Exam  Ortho Exam Imaging: Xr C-arm No Report  Result Date: 11/06/2018 Please see Notes tab for imaging impression.

## 2019-01-06 ENCOUNTER — Other Ambulatory Visit: Payer: Self-pay

## 2019-01-06 DIAGNOSIS — Z20822 Contact with and (suspected) exposure to covid-19: Secondary | ICD-10-CM

## 2019-01-08 LAB — NOVEL CORONAVIRUS, NAA: SARS-CoV-2, NAA: NOT DETECTED

## 2019-01-14 ENCOUNTER — Other Ambulatory Visit: Payer: Self-pay | Admitting: Physical Medicine and Rehabilitation

## 2019-01-14 DIAGNOSIS — M5416 Radiculopathy, lumbar region: Secondary | ICD-10-CM

## 2019-01-14 DIAGNOSIS — M4316 Spondylolisthesis, lumbar region: Secondary | ICD-10-CM

## 2019-01-14 DIAGNOSIS — M5136 Other intervertebral disc degeneration, lumbar region: Secondary | ICD-10-CM

## 2019-01-14 NOTE — Progress Notes (Signed)
See prior notes. MRI of lumbar spine ordered.

## 2019-01-15 ENCOUNTER — Other Ambulatory Visit: Payer: Self-pay | Admitting: Physical Medicine and Rehabilitation

## 2019-01-15 DIAGNOSIS — M4316 Spondylolisthesis, lumbar region: Secondary | ICD-10-CM

## 2019-01-15 DIAGNOSIS — M47816 Spondylosis without myelopathy or radiculopathy, lumbar region: Secondary | ICD-10-CM

## 2019-01-29 ENCOUNTER — Telehealth: Payer: Self-pay | Admitting: Radiology

## 2019-01-29 NOTE — Telephone Encounter (Signed)
Dorothy Armstrong [id number] Case # [id number]  Requesting signed MRI order be sent to [facility] at  [phone]  This has been faxed

## 2019-02-10 ENCOUNTER — Other Ambulatory Visit: Payer: Self-pay | Admitting: Obstetrics & Gynecology

## 2019-02-11 ENCOUNTER — Telehealth: Payer: Self-pay | Admitting: Obstetrics & Gynecology

## 2019-02-11 NOTE — Telephone Encounter (Signed)
Patient needs refill on Paxil. Pharmacy on file.

## 2019-02-11 NOTE — Telephone Encounter (Signed)
Prescription has been sent to pharmacy. See refill encounter 02/10/19. Patient has been notified. Closing encounter.

## 2019-02-11 NOTE — Telephone Encounter (Signed)
Medication refill request: Paxil Last AEX:  01/28/18 SM Next AEX: 05/29/19 Last MMG (if hormonal medication request): 10/09/17 BIRADS 2 benign/density c Refill authorized: Please advise; Order pended #90 w/1 refill if authorized

## 2019-02-13 ENCOUNTER — Other Ambulatory Visit: Payer: Managed Care, Other (non HMO)

## 2019-02-16 ENCOUNTER — Other Ambulatory Visit: Payer: Self-pay | Admitting: Physical Medicine and Rehabilitation

## 2019-02-16 DIAGNOSIS — M47816 Spondylosis without myelopathy or radiculopathy, lumbar region: Secondary | ICD-10-CM | POA: Insufficient documentation

## 2019-02-16 DIAGNOSIS — M4316 Spondylolisthesis, lumbar region: Secondary | ICD-10-CM

## 2019-02-16 MED ORDER — HYDROCODONE-ACETAMINOPHEN 5-325 MG PO TABS: 1.0000 | ORAL_TABLET | Freq: Three times a day (TID) | ORAL | 0 refills | Status: AC | PRN

## 2019-02-16 MED ORDER — TIZANIDINE HCL 4 MG PO TABS: 2.0000 mg | ORAL_TABLET | Freq: Every day | ORAL | 1 refills | Status: DC

## 2019-02-16 NOTE — Progress Notes (Signed)
Spoke with patient's husband and she is having increased pain at night despite PT. She is coming in soon for MRI review. Small amount of hydrocodone and tizanidine

## 2019-03-12 ENCOUNTER — Encounter: Payer: Self-pay | Admitting: Physical Medicine and Rehabilitation

## 2019-03-12 ENCOUNTER — Ambulatory Visit (INDEPENDENT_AMBULATORY_CARE_PROVIDER_SITE_OTHER): Payer: Managed Care, Other (non HMO) | Admitting: Physical Medicine and Rehabilitation

## 2019-03-12 ENCOUNTER — Telehealth: Payer: Self-pay | Admitting: *Deleted

## 2019-03-12 ENCOUNTER — Other Ambulatory Visit (INDEPENDENT_AMBULATORY_CARE_PROVIDER_SITE_OTHER): Payer: Self-pay | Admitting: Physical Medicine and Rehabilitation

## 2019-03-12 VITALS — BP 136/83 | HR 82 | Ht 62.0 in | Wt 220.0 lb

## 2019-03-12 DIAGNOSIS — M47816 Spondylosis without myelopathy or radiculopathy, lumbar region: Secondary | ICD-10-CM | POA: Diagnosis not present

## 2019-03-12 DIAGNOSIS — M4316 Spondylolisthesis, lumbar region: Secondary | ICD-10-CM | POA: Diagnosis not present

## 2019-03-12 DIAGNOSIS — M5442 Lumbago with sciatica, left side: Secondary | ICD-10-CM | POA: Diagnosis not present

## 2019-03-12 DIAGNOSIS — G8929 Other chronic pain: Secondary | ICD-10-CM

## 2019-03-12 NOTE — Telephone Encounter (Signed)
Service [id number] Authorization [id number] Auth Effective Date:10/29/2020Auth End Date:04/27/2021Initiated Date:10/29/2020Decision Date:10/29/2020Decision Type :InitialCase Status:Approved

## 2019-03-12 NOTE — Progress Notes (Signed)
 .  Numeric Pain Rating Scale and Functional Assessment Average Pain 6 Pain Right Now 1 My pain is intermittent, sharp, burning, dull, tingling and aching Pain is worse with: laying down Pain improves with: therapy/exercise   In the last MONTH (on 0-10 scale) has pain interfered with the following?  1. General activity like being  able to carry out your everyday physical activities such as walking, climbing stairs, carrying groceries, or moving a chair?  Rating(3)  2. Relation with others like being able to carry out your usual social activities and roles such as  activities at home, at work and in your community. Rating(3)  3. Enjoyment of life such that you have  been bothered by emotional problems such as feeling anxious, depressed or irritable?  Rating(3)

## 2019-03-13 NOTE — Telephone Encounter (Signed)
Please advise 

## 2019-03-17 ENCOUNTER — Encounter: Payer: Self-pay | Admitting: Physical Medicine and Rehabilitation

## 2019-03-17 NOTE — Progress Notes (Signed)
Dorothy Armstrong - 48 y.o. female MRN [id number]  Date of birth: [birth year]  Office Visit Note: Visit Date: 03/12/2019 PCP: Hoyt Koch, MD Referred by: Hoyt Koch, *  Subjective: Chief Complaint  Patient presents with   Middle Back - Pain   Lower Back - Pain   Left Leg - Pain   Left Foot - Tingling   HPI: Dorothy Armstrong is a 48 y.o. female who comes in today For MRI review and reevaluation of chronic worsening low back pain referring into the left buttock and leg.  By way of quick review we initially saw the patient and started meloxicam and did try a small amount of prednisone which did seem to help initially and then symptoms returned.  We then completed diagnostic facet joint block which seemed to help her back pain but not much of the leg pain but this was short-lived as well.  We then started physical therapy and tried transforaminal epidural steroid injection at L5 or S1.  X-rays revealed facet arthropathy with listhesis and transitional L5 segment.  I would consider this a lumbarized S1 or sacralized L5 I think technically from where the ribs are.  New MRI is reviewed below.  We did have the CD from [facility] to review with her.  They actually determined that she had an L4 on S1 and they determined the transitional segment to be S1.  Nonetheless she continues to have pain into the left lower back and leg.  She gets some tingling at times in the left foot and is hard to know if this is related.  Most of her pain is laying down at night and moving.  A lot of pain in the morning better during the day.  More recently had increased symptoms and we did start tizanidine as well as hydrocodone and this was not very helpful and she is not really taking it at this point.  She is using ibuprofen at this point and has not use the meloxicam since we initially started as she did take a full course of that.  Her pain is intermittent sharp and burning sometimes a dull aching pain.   Has done well with physical therapy but just not getting her where she wants to be.  No focal weakness she ambulates without aid she continues to do everything she needs to do and it does not really affect daily activities other than just being really painful at night in the morning.  Review of Systems  Constitutional: Negative for chills, fever, malaise/fatigue and weight loss.  HENT: Negative for hearing loss and sinus pain.   Eyes: Negative for blurred vision, double vision and photophobia.  Respiratory: Negative for cough and shortness of breath.   Cardiovascular: Negative for chest pain, palpitations and leg swelling.  Gastrointestinal: Negative for abdominal pain, nausea and vomiting.  Genitourinary: Negative for flank pain.  Musculoskeletal: Positive for back pain. Negative for myalgias.  Skin: Negative for itching and rash.  Neurological: Positive for tingling. Negative for tremors, focal weakness and weakness.  Endo/Heme/Allergies: Negative.   Psychiatric/Behavioral: Negative for depression.  All other systems reviewed and are negative.  Otherwise per HPI.  Assessment & Plan: Visit Diagnoses:  1. Spondylosis without myelopathy or radiculopathy, lumbar region   2. Spondylolisthesis of lumbar region   3. Chronic left-sided low back pain with left-sided sciatica     Plan: Findings:  Chronic worsening left-sided low back and buttock pain that began in January of this year without  inciting injury.  Has gotten some relief with prednisone and facet joint block less relief with transforaminal injection.  Transforaminal injection was interesting because she does have this transitional segment and even though the flow contrast was okay I am not sure that we actually got much of the way of medication and there potentially.  I think looking at the MRI today there is no focal nerve compression which is good there is no central stenosis there is arthritis at L5-S1 or L4-S1 using the numbering  scheme of the MRI.  There is listhesis.  There is likely lateral recess narrowing.  I think the best approach is interlaminar epidural injection as we know that despite the transitional quality of the segment this should allow more medication into the area.  We are also going to restart meloxicam just over the short while.  Should continue doing home exercises.    Meds & Orders: No orders of the defined types were placed in this encounter.  No orders of the defined types were placed in this encounter.   Follow-up: Return for Left L4-S1 interlaminar epidural steroid injection..   Procedures: No procedures performed  No notes on file   Clinical History: MRI LUMBAR SPINE WITHOUT CONTRAST, 02/05/2019 2:40 PM  INDICATION: \ M51.36 DDD (degenerative disc disease), lumbar   COMPARISON: CT abdomen and pelvis of 01/30/2018  TECHNIQUE: Multiplanar, multisequence surface-coil magnetic resonance imaging of the lumbar spine was performed without contrast.  LEVELS IMAGED: Lower thoracic to the upper sacral region.  FINDINGS:   Transitional lumbosacral anatomy is present with a rudimentary S1-S2 disc space. There are 4 lumbar type nonrib-bearing vertebra. The disc space identified on axial image #40 will be referred to as the L4-S1 vertebra for the purposes of this examination.  T12-L1: Unremarkable.  L1-L2: Unremarkable.  L2-L3: Small right facet joint effusion.  L3-L4: Small right facet joint effusion.  L4-S1: Mild disc desiccation and loss of disc space height. Severe right and moderately severe left facet joint hypertrophy with grade I anterolisthesis of L4 on S1 contributes to moderate effacement of subarticular recesses and mild spinal canal stenosis without significant neural foraminal compromise.  The conus terminates at L1. Vertebral body alignment and vertebral body height are otherwise maintained throughout the lumbar spine with no spondylolisthesis or abnormality of marrow or spinal  cord signal identified.  CONCLUSION: :  1. 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 2. Severe right and moderately severe left L4-S1 facet joint arthrosis with grade I anterolisthesis of L4 on S1 contribute to moderate effacement of subarticular recesses and mild acquired spinal canal stenosis at this level.  ----- LUMBAR SPINE - COMPLETE 4+ VIEW  COMPARISON: None.  FINDINGS: Five lumbar type vertebral bodies show normal alignment at L4-5 and above, with 13 mm of anterolisthesis at L5-S1. Disc space narrowing L5-S1. Lower lumbar facet arthropathy, most pronounced at L5-S1. No evidence of pars defect. No fracture or focal lesion otherwise. IUD present within the central pelvis.  IMPRESSION: Degenerative anterolisthesis at L5-S1 of 13 mm. L5-S1 disc space narrowing. Findings at this level could certainly be painful.   Electronically Signed By: Nelson Chimes M.D. On: 07/02/2018 09:23   She reports that she has never smoked. She has never used smokeless tobacco. No results for input(s): HGBA1C, LABURIC in the last 8760 hours.  Objective:  VS:  HT:5\' 2"  (157.5 cm)    WT:220 lb (99.8 kg)   BMI:40.23     BP:136/83   HR:82bpm   TEMP: ( )  RESP:  Physical Exam Vitals signs and nursing note reviewed.  Constitutional:      General: She is not in acute distress.    Appearance: Normal appearance. She is well-developed. She is obese. She is not ill-appearing.  HENT:     Head: Normocephalic and atraumatic.  Eyes:     Conjunctiva/sclera: Conjunctivae normal.     Pupils: Pupils are equal, round, and reactive to light.  Cardiovascular:     Rate and Rhythm: Normal rate.     Pulses: Normal pulses.  Pulmonary:     Effort: Pulmonary effort is normal.  Musculoskeletal:     Right lower leg: No edema.     Left lower leg: No edema.     Comments: Concordant low back pain with extension and facet loading.  Some tenderness over the greater trochanter left  more than right good distal strength and ambulates without aid.  Skin:    General: Skin is warm and dry.     Findings: No erythema or rash.  Neurological:     General: No focal deficit present.     Mental Status: She is alert and oriented to person, place, and time.     Sensory: No sensory deficit.     Motor: No abnormal muscle tone.     Coordination: Coordination normal.     Gait: Gait normal.  Psychiatric:        Mood and Affect: Mood normal.        Behavior: Behavior normal.     Ortho Exam Imaging: No results found.  Past Medical/Family/Surgical/Social History: Medications & Allergies reviewed per EMR, new medications updated. Patient Active Problem List   Diagnosis Date Noted   Spondylosis without myelopathy or radiculopathy, lumbar region 02/16/2019   Spondylolisthesis of lumbar region 02/16/2019   Chronic left-sided low back pain with left-sided sciatica 07/01/2018   Right upper quadrant abdominal pain 09/20/2017   Routine general medical examination at a health care facility 01/30/2016   IUD (intrauterine device) in place 08/30/2015   Fatty liver disease, nonalcoholic 0000000   Obesity 11/08/2014   Splenomegaly 11/08/2014   Menorrhagia with regular cycle 08/01/2014   OSTEOARTHRITIS 02/05/2008   NEPHROLITHIASIS, HX OF 02/05/2008   Past Medical History:  Diagnosis Date   Ankle fracture    Arthritis    bilateral knee   Hiatal hernia    IBS (irritable bowel syndrome)    Kidney calculi    kidney stone   Family History  Problem Relation Age of Onset   Breast cancer Paternal Aunt    Hypertension Father    Hypertension Paternal Grandfather    CVA Paternal Grandfather    Colon cancer Paternal Grandmother    Past Surgical History:  Procedure Laterality Date   CESAREAN SECTION  2001, 2004   x2   CYSTOSCOPY/RETROGRADE/URETEROSCOPY/STONE EXTRACTION WITH BASKET  2001   WISDOM TOOTH EXTRACTION     Social History   Occupational  History   Not on file  Tobacco Use   Smoking status: Never Smoker   Smokeless tobacco: Never Used  Substance and Sexual Activity   Alcohol use: Yes    Alcohol/week: 1.0 standard drinks    Types: 1 Glasses of wine per week   Drug use: No   Sexual activity: Yes    Partners: Male    Birth control/protection: Other-see comments, I.U.D.    Comment: vasectomy

## 2019-03-19 ENCOUNTER — Ambulatory Visit: Payer: Self-pay

## 2019-03-19 ENCOUNTER — Encounter: Payer: Self-pay | Admitting: Physical Medicine and Rehabilitation

## 2019-03-19 ENCOUNTER — Ambulatory Visit: Payer: Managed Care, Other (non HMO) | Admitting: Physical Medicine and Rehabilitation

## 2019-03-19 ENCOUNTER — Other Ambulatory Visit: Payer: Self-pay

## 2019-03-19 VITALS — BP 131/86 | HR 95

## 2019-03-19 DIAGNOSIS — M5416 Radiculopathy, lumbar region: Secondary | ICD-10-CM

## 2019-03-19 MED ORDER — METHYLPREDNISOLONE ACETATE 80 MG/ML IJ SUSP
80.0000 mg | Freq: Once | INTRAMUSCULAR | Status: AC
  Administered 2019-03-19: 80 mg

## 2019-03-19 NOTE — Progress Notes (Signed)
 .  Numeric Pain Rating Scale and Functional Assessment Average Pain 6   In the last MONTH (on 0-10 scale) has pain interfered with the following?  1. General activity like being  able to carry out your everyday physical activities such as walking, climbing stairs, carrying groceries, or moving a chair?  Rating(7)   +Driver, -BT, -Dye Allergies.  

## 2019-04-28 ENCOUNTER — Telehealth: Payer: Self-pay | Admitting: *Deleted

## 2019-04-28 NOTE — Progress Notes (Signed)
DELVA LLERA - 48 y.o. female MRN [id number]  Date of birth: 1971/03/02  Office Visit Note: Visit Date: 03/19/2019 PCP: Hoyt Koch, MD Referred by: Hoyt Koch, *  Subjective: Chief Complaint  Patient presents with  . Lower Back - Pain  . Left Leg - Pain   HPI:  Dorothy Armstrong is a 48 y.o. female who comes in today For planned left L5-S1 interlaminar steroid injection.  Please see her prior notes for further details justification.  She got moderate relief with prior procedure.  She has transitional segment with listhesis of 5 on S1.  Interlaminar approach hopefully will help her.  ROS Otherwise per HPI.  Assessment & Plan: Visit Diagnoses:  1. Lumbar radiculopathy     Plan: No additional findings.   Meds & Orders:  Meds ordered this encounter  Medications  . methylPREDNISolone acetate (DEPO-MEDROL) injection 80 mg    Orders Placed This Encounter  Procedures  . XR C-ARM NO REPORT  . Epidural Steroid injection    Follow-up: Return if symptoms worsen or fail to improve.   Procedures: No procedures performed  Lumbar Epidural Steroid Injection - Interlaminar Approach with Fluoroscopic Guidance  Patient: Dorothy Armstrong      Date of Birth: 1970-06-27 MRN: [id number] PCP: Hoyt Koch, MD      Visit Date: 03/19/2019   Universal Protocol:     Consent Given By: the patient  Position: PRONE  Additional Comments: Vital signs were monitored before and after the procedure. Patient was prepped and draped in the usual sterile fashion. The correct patient, procedure, and site was verified.   Injection Procedure Details:  Procedure Site One Meds Administered:  Meds ordered this encounter  Medications  . methylPREDNISolone acetate (DEPO-MEDROL) injection 80 mg     Laterality: Left  Location/Site: She does have transitional segment in the leg with his lower segment and L4-S1. L5-S1  Needle size: 20 G  Needle type: Tuohy  Needle  Placement: Paramedian epidural  Findings:   -Comments: Excellent flow of contrast into the epidural space.  Procedure Details: Using a paramedian approach from the side mentioned above, the region overlying the inferior lamina was localized under fluoroscopic visualization and the soft tissues overlying this structure were infiltrated with 4 ml. of 1% Lidocaine without Epinephrine. The Tuohy needle was inserted into the epidural space using a paramedian approach.   The epidural space was localized using loss of resistance along with lateral and bi-planar fluoroscopic views.  After negative aspirate for air, blood, and CSF, a 2 ml. volume of Isovue-250 was injected into the epidural space and the flow of contrast was observed. Radiographs were obtained for documentation purposes.    The injectate was administered into the level noted above.   Additional Comments:   Dressing: 2 x 2 sterile gauze and Band-Aid    Post-procedure details: Patient was observed during the procedure. Post-procedure instructions were reviewed.  Patient left the clinic in stable condition.    Clinical History: MRI LUMBAR SPINE WITHOUT CONTRAST, 02/05/2019 2:40 PM  INDICATION: \ M51.36 DDD (degenerative disc disease), lumbar   COMPARISON: CT abdomen and pelvis of 01/30/2018  TECHNIQUE: Multiplanar, multisequence surface-coil magnetic resonance imaging of the lumbar spine was performed without contrast.  LEVELS IMAGED: Lower thoracic to the upper sacral region.  FINDINGS:   Transitional lumbosacral anatomy is present with a rudimentary S1-S2 disc space. There are 4 lumbar type nonrib-bearing vertebra. The disc space identified on axial image #40 will be referred  to as the L4-S1 vertebra for the purposes of this examination.  T12-L1: Unremarkable.  L1-L2: Unremarkable.  L2-L3: Small right facet joint effusion.  L3-L4: Small right facet joint effusion.  L4-S1: Mild disc desiccation and loss of disc  space height. Severe right and moderately severe left facet joint hypertrophy with grade I anterolisthesis of L4 on S1 contributes to moderate effacement of subarticular recesses and mild spinal canal stenosis without significant neural foraminal compromise.  The conus terminates at L1. Vertebral body alignment and vertebral body height are otherwise maintained throughout the lumbar spine with no spondylolisthesis or abnormality of marrow or spinal cord signal identified.  CONCLUSION: :  1. 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 2. Severe right and moderately severe left L4-S1 facet joint arthrosis with grade I anterolisthesis of L4 on S1 contribute to moderate effacement of subarticular recesses and mild acquired spinal canal stenosis at this level.  ----- LUMBAR SPINE - COMPLETE 4+ VIEW  COMPARISON: None.  FINDINGS: Five lumbar type vertebral bodies show normal alignment at L4-5 and above, with 13 mm of anterolisthesis at L5-S1. Disc space narrowing L5-S1. Lower lumbar facet arthropathy, most pronounced at L5-S1. No evidence of pars defect. No fracture or focal lesion otherwise. IUD present within the central pelvis.  IMPRESSION: Degenerative anterolisthesis at L5-S1 of 13 mm. L5-S1 disc space narrowing. Findings at this level could certainly be painful.   Electronically Signed By: Nelson Chimes M.D. On: 07/02/2018 09:23     Objective:  VS:  HT:    WT:   BMI:     BP:131/86  HR:95bpm  TEMP: ( )  RESP:  Physical Exam  Ortho Exam Imaging: No results found.

## 2019-04-28 NOTE — Telephone Encounter (Signed)
Lt S1 TF  Service [phone] Authorization [id number] Auth Effective Date:12/15/2020Auth End Date:06/13/2021Initiated Date:12/15/2020Decision Date:12/15/2020Decision Type :InitialCase Status:Approved

## 2019-04-28 NOTE — Procedures (Signed)
Lumbar Epidural Steroid Injection - Interlaminar Approach with Fluoroscopic Guidance  Patient: Dorothy Armstrong      Date of Birth: Sep 21, 1970 MRN: [id number] PCP: Hoyt Koch, MD      Visit Date: 03/19/2019   Universal Protocol:     Consent Given By: the patient  Position: PRONE  Additional Comments: Vital signs were monitored before and after the procedure. Patient was prepped and draped in the usual sterile fashion. The correct patient, procedure, and site was verified.   Injection Procedure Details:  Procedure Site One Meds Administered:  Meds ordered this encounter  Medications  . methylPREDNISolone acetate (DEPO-MEDROL) injection 80 mg     Laterality: Left  Location/Site: She does have transitional segment in the leg with his lower segment and L4-S1. L5-S1  Needle size: 20 G  Needle type: Tuohy  Needle Placement: Paramedian epidural  Findings:   -Comments: Excellent flow of contrast into the epidural space.  Procedure Details: Using a paramedian approach from the side mentioned above, the region overlying the inferior lamina was localized under fluoroscopic visualization and the soft tissues overlying this structure were infiltrated with 4 ml. of 1% Lidocaine without Epinephrine. The Tuohy needle was inserted into the epidural space using a paramedian approach.   The epidural space was localized using loss of resistance along with lateral and bi-planar fluoroscopic views.  After negative aspirate for air, blood, and CSF, a 2 ml. volume of Isovue-250 was injected into the epidural space and the flow of contrast was observed. Radiographs were obtained for documentation purposes.    The injectate was administered into the level noted above.   Additional Comments:   Dressing: 2 x 2 sterile gauze and Band-Aid    Post-procedure details: Patient was observed during the procedure. Post-procedure instructions were reviewed.  Patient left the clinic in  stable condition.

## 2019-04-28 NOTE — Telephone Encounter (Signed)
Lt L5-S1 IL  Service [phone] Authorization [id number] Auth Effective Date:12/15/2020Auth End Date:06/13/2021Initiated Date:12/15/2020Decision Date:12/15/2020Decision Type :InitialCase Status:Approved

## 2019-04-29 ENCOUNTER — Ambulatory Visit: Payer: Self-pay

## 2019-04-29 ENCOUNTER — Encounter: Payer: Self-pay | Admitting: Physical Medicine and Rehabilitation

## 2019-04-29 ENCOUNTER — Ambulatory Visit: Payer: Managed Care, Other (non HMO) | Admitting: Physical Medicine and Rehabilitation

## 2019-04-29 ENCOUNTER — Other Ambulatory Visit: Payer: Self-pay

## 2019-04-29 VITALS — BP 125/89 | HR 78

## 2019-04-29 DIAGNOSIS — M5416 Radiculopathy, lumbar region: Secondary | ICD-10-CM

## 2019-04-29 DIAGNOSIS — M4316 Spondylolisthesis, lumbar region: Secondary | ICD-10-CM

## 2019-04-29 MED ORDER — METHYLPREDNISOLONE ACETATE 80 MG/ML IJ SUSP
80.0000 mg | Freq: Once | INTRAMUSCULAR | Status: AC
  Administered 2019-04-29: 80 mg

## 2019-04-29 NOTE — Progress Notes (Signed)
.  Numeric Pain Rating Scale and Functional Assessment Average Pain 7   In the last MONTH (on 0-10 scale) has pain interfered with the following?  1. General activity like being  able to carry out your everyday physical activities such as walking, climbing stairs, carrying groceries, or moving a chair?  Rating(7)   +Driver, -BT, -Dye Allergies.   

## 2019-05-06 NOTE — Progress Notes (Signed)
KIWANA PURINGTON - 48 y.o. female MRN [id number]  Date of birth: 1970/07/05  Office Visit Note: Visit Date: 04/29/2019 PCP: Hoyt Koch, MD Referred by: Hoyt Koch, *  Subjective: Chief Complaint  Patient presents with  . Lower Back - Pain  . Left Thigh - Pain   HPI:  Dorothy Armstrong is a 48 y.o. female who comes in today For planned repeat left L4-S1 (using the numbering scheme from the MRI done at [facility]) for chronic worsening left low back and hip and leg pain.  Her history can be reviewed in our other notes.  She has listhesis of L4 on S1.  This is arthritic in nature without spondylolysis.  Transforaminal injection was not very beneficial and facet joint blocks helped to some degree with her back pain but not her hip and leg pain.  Interlaminar injection performed on 03/19/2019 actually helped out greatly and she was almost 100% relief for several weeks and then slow return of symptoms and still better than she was initially when the pain started several months ago.  She continues with home exercises and has had physical therapy.  She has had medication management as well.  MRI did not show any focal nerve compression.  We will repeat the injection today and see how she does over time.  She may need intermittent injection at times if the symptoms are really flared up.  No red flag complaints or new problems no focal weakness.  ROS Otherwise per HPI.  Assessment & Plan: Visit Diagnoses:  1. Lumbar radiculopathy   2. Spondylolisthesis of lumbar region     Plan: No additional findings.   Meds & Orders:  Meds ordered this encounter  Medications  . methylPREDNISolone acetate (DEPO-MEDROL) injection 80 mg    Orders Placed This Encounter  Procedures  . XR C-ARM NO REPORT  . Epidural Steroid injection    Follow-up: Return if symptoms worsen or fail to improve.   Procedures: No procedures performed  Lumbar Epidural Steroid Injection - Interlaminar Approach  with Fluoroscopic Guidance  Patient: Dorothy Armstrong      Date of Birth: [birth year] MRN: [id number] PCP: Hoyt Koch, MD      Visit Date: 04/29/2019   Universal Protocol:     Consent Given By: the patient  Position: PRONE  Additional Comments: Vital signs were monitored before and after the procedure. Patient was prepped and draped in the usual sterile fashion. The correct patient, procedure, and site was verified.   Injection Procedure Details:  Procedure Site One Meds Administered:  Meds ordered this encounter  Medications  . methylPREDNISolone acetate (DEPO-MEDROL) injection 80 mg     Laterality: Left  Location/Site: L4-S1 as numbered on the [facility] L5-S1  Needle size: 20 G  Needle type: Tuohy  Needle Placement: Paramedian epidural  Findings:   -Comments: Excellent flow of contrast into the epidural space.  Procedure Details: Using a paramedian approach from the side mentioned above, the region overlying the inferior lamina was localized under fluoroscopic visualization and the soft tissues overlying this structure were infiltrated with 4 ml. of 1% Lidocaine without Epinephrine. The Tuohy needle was inserted into the epidural space using a paramedian approach.   The epidural space was localized using loss of resistance along with lateral and bi-planar fluoroscopic views.  After negative aspirate for air, blood, and CSF, a 2 ml. volume of Isovue-250 was injected into the epidural space and the flow of contrast was observed. Radiographs  were obtained for documentation purposes.    The injectate was administered into the level noted above.   Additional Comments:  The patient tolerated the procedure well Dressing: 2 x 2 sterile gauze and Band-Aid    Post-procedure details: Patient was observed during the procedure. Post-procedure instructions were reviewed.  Patient left the clinic in stable condition.    Clinical History: MRI LUMBAR SPINE  WITHOUT CONTRAST, 02/05/2019 2:40 PM  INDICATION: \ M51.36 DDD (degenerative disc disease), lumbar   COMPARISON: CT abdomen and pelvis of 01/30/2018  TECHNIQUE: Multiplanar, multisequence surface-coil magnetic resonance imaging of the lumbar spine was performed without contrast.  LEVELS IMAGED: Lower thoracic to the upper sacral region.  FINDINGS:   Transitional lumbosacral anatomy is present with a rudimentary S1-S2 disc space. There are 4 lumbar type nonrib-bearing vertebra. The disc space identified on axial image #40 will be referred to as the L4-S1 vertebra for the purposes of this examination.  T12-L1: Unremarkable.  L1-L2: Unremarkable.  L2-L3: Small right facet joint effusion.  L3-L4: Small right facet joint effusion.  L4-S1: Mild disc desiccation and loss of disc space height. Severe right and moderately severe left facet joint hypertrophy with grade I anterolisthesis of L4 on S1 contributes to moderate effacement of subarticular recesses and mild spinal canal stenosis without significant neural foraminal compromise.  The conus terminates at L1. Vertebral body alignment and vertebral body height are otherwise maintained throughout the lumbar spine with no spondylolisthesis or abnormality of marrow or spinal cord signal identified.  CONCLUSION: :  1. 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 2. Severe right and moderately severe left L4-S1 facet joint arthrosis with grade I anterolisthesis of L4 on S1 contribute to moderate effacement of subarticular recesses and mild acquired spinal canal stenosis at this level.  ----- LUMBAR SPINE - COMPLETE 4+ VIEW  COMPARISON: None.  FINDINGS: Five lumbar type vertebral bodies show normal alignment at L4-5 and above, with 13 mm of anterolisthesis at L5-S1. Disc space narrowing L5-S1. Lower lumbar facet arthropathy, most pronounced at L5-S1. No evidence of pars defect. No fracture or focal lesion  otherwise. IUD present within the central pelvis.  IMPRESSION: Degenerative anterolisthesis at L5-S1 of 13 mm. L5-S1 disc space narrowing. Findings at this level could certainly be painful.   Electronically Signed By: Nelson Chimes M.D. On: 07/02/2018 09:23     Objective:  VS:  HT:    WT:   BMI:     BP:125/89  HR:78bpm  TEMP: ( )  RESP:  Physical Exam  Ortho Exam Imaging: No results found.

## 2019-05-06 NOTE — Procedures (Signed)
Lumbar Epidural Steroid Injection - Interlaminar Approach with Fluoroscopic Guidance  Patient: Dorothy Armstrong      Date of Birth: August 21, 1970 MRN: [id number] PCP: Hoyt Koch, MD      Visit Date: 04/29/2019   Universal Protocol:     Consent Given By: the patient  Position: PRONE  Additional Comments: Vital signs were monitored before and after the procedure. Patient was prepped and draped in the usual sterile fashion. The correct patient, procedure, and site was verified.   Injection Procedure Details:  Procedure Site One Meds Administered:  Meds ordered this encounter  Medications  . methylPREDNISolone acetate (DEPO-MEDROL) injection 80 mg     Laterality: Left  Location/Site: L4-S1 as numbered on the [facility] MRI L5-S1  Needle size: 20 G  Needle type: Tuohy  Needle Placement: Paramedian epidural  Findings:   -Comments: Excellent flow of contrast into the epidural space.  Procedure Details: Using a paramedian approach from the side mentioned above, the region overlying the inferior lamina was localized under fluoroscopic visualization and the soft tissues overlying this structure were infiltrated with 4 ml. of 1% Lidocaine without Epinephrine. The Tuohy needle was inserted into the epidural space using a paramedian approach.   The epidural space was localized using loss of resistance along with lateral and bi-planar fluoroscopic views.  After negative aspirate for air, blood, and CSF, a 2 ml. volume of Isovue-250 was injected into the epidural space and the flow of contrast was observed. Radiographs were obtained for documentation purposes.    The injectate was administered into the level noted above.   Additional Comments:  The patient tolerated the procedure well Dressing: 2 x 2 sterile gauze and Band-Aid    Post-procedure details: Patient was observed during the procedure. Post-procedure instructions were reviewed.  Patient left the clinic in  stable condition.

## 2019-05-26 NOTE — Progress Notes (Signed)
49 y.o. G2P2 Married White or Caucasian female here for annual exam..  Doing well.  Denies vaginal bleeding.  Teaches preschool.  She is going to get the Covid vaccine with the next group.    No LMP recorded. (Menstrual status: IUD).          Sexually active: Yes.    The current method of family planning is IUD.   Mirena placed 07/29/14.   Exercising: Yes.    yoga/stretching Smoker:  no  Health Maintenance: Pap:  11/16/16 Neg. HR HPV:neg     History of abnormal Pap:  yes (was not high grade dysplasia) MMG: 10-09-17 BIRADS 2 benign Colonoscopy:  Never.    BMD:   Never TDaP:  2011 Screening Labs: discuss with provider   reports that she has never smoked. She has never used smokeless tobacco. She reports current alcohol use of about 1.0 standard drinks of alcohol per week. She reports that she does not use drugs.  Past Medical History:  Diagnosis Date  . Ankle fracture   . Arthritis    bilateral knee  . Hiatal hernia   . IBS (irritable bowel syndrome)   . Kidney calculi    kidney stone    Past Surgical History:  Procedure Laterality Date  . CESAREAN SECTION  2001, 2004   x2  . CYSTOSCOPY/RETROGRADE/URETEROSCOPY/STONE EXTRACTION WITH BASKET  2001  . WISDOM TOOTH EXTRACTION      Current Outpatient Medications  Medication Sig Dispense Refill  . glucosamine-chondroitin 500-400 MG tablet Take 1 tablet by mouth 3 (three) times daily.    . Multiple Vitamins-Minerals (MULTIVITAMIN PO) Take by mouth daily.    Marland Kitchen PARoxetine (PAXIL) 20 MG tablet TAKE 1 TABLET BY MOUTH EVERY DAY 90 tablet 1  . Vitamin D, Ergocalciferol, (DRISDOL) 50000 units CAPS capsule Take 1 capsule (50,000 Units total) by mouth 2 (two) times a week. 24 capsule 0   No current facility-administered medications for this visit.    Family History  Problem Relation Age of Onset  . Breast cancer Paternal Aunt   . Hypertension Father   . Hypertension Paternal Grandfather   . CVA Paternal Grandfather   . Colon cancer  Paternal Grandmother     Review of Systems  All other systems reviewed and are negative.   Exam:   BP 130/80 (BP Location: Right Arm, Patient Position: Sitting, Cuff Size: Normal)   Pulse 82   Temp 97.9 F (36.6 C) (Skin)   Resp 14   Ht 5' 2.5" (1.588 m)   Wt 237 lb 1.6 oz (107.5 kg)   BMI 42.68 kg/m   Height: 5' 2.5" (158.8 cm)  Ht Readings from Last 3 Encounters:  05/29/19 5' 2.5" (1.588 m)  03/12/19 5\' 2"  (1.575 m)  07/16/18 5\' 2"  (1.575 m)   General appearance: alert, cooperative and appears stated age Head: Normocephalic, without obvious abnormality, atraumatic Neck: no adenopathy, supple, symmetrical, trachea midline and thyroid normal to inspection and palpation Lungs: clear to auscultation bilaterally Breasts: normal appearance, no masses or tenderness Heart: regular rate and rhythm Abdomen: soft, non-tender; bowel sounds normal; no masses,  no organomegaly Extremities: extremities normal, atraumatic, no cyanosis or edema Skin: Skin color, texture, turgor normal. No rashes or lesions Lymph nodes: Cervical, supraclavicular, and axillary nodes normal. No abnormal inguinal nodes palpated Neurologic: Grossly normal   Pelvic: External genitalia:  no lesions              Urethra:  normal appearing urethra with no masses, tenderness or  lesions              Bartholins and Skenes: normal                 Vagina: normal appearing vagina with normal color and discharge, no lesions              Cervix: no lesions              Pap taken: No. Bimanual Exam:  Uterus:  normal size, contour, position, consistency, mobility, non-tender              Adnexa: normal adnexa and no mass, fullness, tenderness               Rectovaginal: Confirms               Anus:  normal sphincter tone, no lesions  Procedure:  Speculum placed.  IUD string noted and grasped with ringed forceps.  IUD removed with one pull.  Cervix cleansed with Betadine and single toothed tenaculum applied to anterior  lip.  Uterus sounded to 9 cm.  IUD with introducer passed to fundus and slightly withdrawn.  IUD released.  Introducer removed.  Strings cut to 2cm.  Tenaculum removed.  No bleeding noted.  Pt toelrated procedure well.  IUD Lot:  [id number], Exp: 05/2021  Chaperone, Karmen Bongo, RN, was present for exam.  A:  Well Woman with normal exam Mirena IUD for contraception and bleeding control Fatty liver disease H/O enlarged spleen on ultrasound, CT 9/19 normal spleen H/o elevated sed rate in 2019 with normal repeat  P:   Mammogram guideines reviewed.  Pt aware this is due. pap smear with HR HPV obtained today IUD removed and replaced today Pt will return for fasting lab work:  CBC, CMP, Lipids, TSH and Vit D.  Orders placed.   Tdap updated today Recheck 8 weeks Return annually or prn

## 2019-05-29 ENCOUNTER — Encounter: Payer: Self-pay | Admitting: Obstetrics & Gynecology

## 2019-05-29 ENCOUNTER — Other Ambulatory Visit: Payer: Self-pay

## 2019-05-29 ENCOUNTER — Ambulatory Visit: Payer: Managed Care, Other (non HMO) | Admitting: Obstetrics & Gynecology

## 2019-05-29 ENCOUNTER — Other Ambulatory Visit (HOSPITAL_COMMUNITY)
Admission: RE | Admit: 2019-05-29 | Discharge: 2019-05-29 | Disposition: A | Discharge status: Home / Self Care | Payer: Managed Care, Other (non HMO) | Source: Ambulatory Visit | Attending: Obstetrics & Gynecology | Admitting: Obstetrics & Gynecology

## 2019-05-29 VITALS — BP 130/80 | HR 82 | Temp 97.9°F | Resp 14 | Ht 62.5 in | Wt 237.1 lb

## 2019-05-29 DIAGNOSIS — Z30433 Encounter for removal and reinsertion of intrauterine contraceptive device: Secondary | ICD-10-CM

## 2019-05-29 DIAGNOSIS — Z23 Encounter for immunization: Secondary | ICD-10-CM

## 2019-05-29 DIAGNOSIS — Z124 Encounter for screening for malignant neoplasm of cervix: Secondary | ICD-10-CM | POA: Diagnosis present

## 2019-05-29 DIAGNOSIS — Z1211 Encounter for screening for malignant neoplasm of colon: Secondary | ICD-10-CM | POA: Diagnosis not present

## 2019-05-29 DIAGNOSIS — Z01419 Encounter for gynecological examination (general) (routine) without abnormal findings: Secondary | ICD-10-CM

## 2019-05-29 DIAGNOSIS — Z0000 Encounter for general adult medical examination without abnormal findings: Secondary | ICD-10-CM

## 2019-05-29 DIAGNOSIS — R161 Splenomegaly, not elsewhere classified: Secondary | ICD-10-CM

## 2019-05-29 NOTE — Patient Instructions (Signed)

## 2019-06-01 ENCOUNTER — Other Ambulatory Visit (INDEPENDENT_AMBULATORY_CARE_PROVIDER_SITE_OTHER): Payer: Managed Care, Other (non HMO)

## 2019-06-01 ENCOUNTER — Other Ambulatory Visit: Payer: Self-pay

## 2019-06-01 DIAGNOSIS — Z0000 Encounter for general adult medical examination without abnormal findings: Secondary | ICD-10-CM

## 2019-06-02 LAB — COMPREHENSIVE METABOLIC PANEL
ALT: 19 IU/L (ref 0–32)
AST: 17 IU/L (ref 0–40)
Albumin/Globulin Ratio: 1.5 (ref 1.2–2.2)
Albumin: 4.1 g/dL (ref 3.8–4.8)
Alkaline Phosphatase: 89 IU/L (ref 39–117)
BUN/Creatinine Ratio: 11 (ref 9–23)
BUN: 7 mg/dL (ref 6–24)
Bilirubin Total: 0.3 mg/dL (ref 0.0–1.2)
CO2: 22 mmol/L (ref 20–29)
Calcium: 9.1 mg/dL (ref 8.7–10.2)
Chloride: 104 mmol/L (ref 96–106)
Creatinine, Ser: 0.63 mg/dL (ref 0.57–1.00)
GFR calc Af Amer: 122 mL/min/{1.73_m2} (ref >59)
GFR calc non Af Amer: 106 mL/min/{1.73_m2} (ref >59)
Globulin, Total: 2.7 g/dL (ref 1.5–4.5)
Glucose: 107 mg/dL — ABNORMAL HIGH (ref 65–99)
Potassium: 3.9 mmol/L (ref 3.5–5.2)
Sodium: 139 mmol/L (ref 134–144)
Total Protein: 6.8 g/dL (ref 6.0–8.5)

## 2019-06-02 LAB — CBC
Hematocrit: 36.2 % (ref 34.0–46.6)
Hemoglobin: 12.9 g/dL (ref 11.1–15.9)
MCH: 31.9 pg (ref 26.6–33.0)
MCHC: 35.6 g/dL (ref 31.5–35.7)
MCV: 90 fL (ref 79–97)
Platelets: 267 10*3/uL (ref 150–450)
RBC: 4.04 x10E6/uL (ref 3.77–5.28)
RDW: 12.9 % (ref 11.7–15.4)
WBC: 7.7 10*3/uL (ref 3.4–10.8)

## 2019-06-02 LAB — LIPID PANEL
Chol/HDL Ratio: 3.3 ratio (ref 0.0–4.4)
Cholesterol, Total: 166 mg/dL (ref 100–199)
HDL: 51 mg/dL (ref >39)
LDL Chol Calc (NIH): 93 mg/dL (ref 0–99)
Triglycerides: 123 mg/dL (ref 0–149)
VLDL Cholesterol Cal: 22 mg/dL (ref 5–40)

## 2019-06-02 LAB — TSH: TSH: 2.17 u[IU]/mL (ref 0.450–4.500)

## 2019-06-02 LAB — VITAMIN D 25 HYDROXY (VIT D DEFICIENCY, FRACTURES): Vit D, 25-Hydroxy: 20 ng/mL — ABNORMAL LOW (ref 30.0–100.0)

## 2019-06-02 LAB — CYTOLOGY - PAP
Comment: NEGATIVE
Diagnosis: NEGATIVE
High risk HPV: NEGATIVE

## 2019-06-18 ENCOUNTER — Encounter: Payer: Self-pay | Admitting: Obstetrics & Gynecology

## 2019-06-23 ENCOUNTER — Telehealth: Payer: Self-pay | Admitting: Physical Medicine and Rehabilitation

## 2019-06-23 NOTE — Telephone Encounter (Signed)
Service [phone] Authorization [id number] Auth Effective Date:02/09/2021Auth End Date:08/08/2021Initiated Date:02/09/2021Decision Date:02/09/2021Decision Type :InitialCase Status:Approved

## 2019-06-23 NOTE — Telephone Encounter (Signed)
Pt is scheduled 07/06/19 with driver.

## 2019-07-06 ENCOUNTER — Ambulatory Visit: Payer: Self-pay

## 2019-07-06 ENCOUNTER — Encounter: Payer: Self-pay | Admitting: Physical Medicine and Rehabilitation

## 2019-07-06 ENCOUNTER — Ambulatory Visit: Payer: Managed Care, Other (non HMO) | Admitting: Physical Medicine and Rehabilitation

## 2019-07-06 ENCOUNTER — Other Ambulatory Visit: Payer: Self-pay

## 2019-07-06 VITALS — BP 138/86 | HR 81

## 2019-07-06 DIAGNOSIS — M4316 Spondylolisthesis, lumbar region: Secondary | ICD-10-CM | POA: Diagnosis not present

## 2019-07-06 DIAGNOSIS — M5416 Radiculopathy, lumbar region: Secondary | ICD-10-CM | POA: Diagnosis not present

## 2019-07-06 MED ORDER — METHYLPREDNISOLONE ACETATE 80 MG/ML IJ SUSP
40.0000 mg | Freq: Once | INTRAMUSCULAR | Status: AC
  Administered 2019-07-06: 15:00:00 40 mg

## 2019-07-06 MED ORDER — CYCLOBENZAPRINE HCL 10 MG PO TABS: 10.0000 mg | ORAL_TABLET | Freq: Every day | ORAL | 2 refills | Status: DC

## 2019-07-06 NOTE — Progress Notes (Signed)
.  Numeric Pain Rating Scale and Functional Assessment Average Pain 7   In the last MONTH (on 0-10 scale) has pain interfered with the following?  1. General activity like being  able to carry out your everyday physical activities such as walking, climbing stairs, carrying groceries, or moving a chair?  Rating(7)   +Driver, -BT, -Dye Allergies.   

## 2019-07-07 NOTE — Progress Notes (Signed)
Dorothy Armstrong - 49 y.o. female MRN [id number]  Date of birth: 09/16/1970  Office Visit Note: Visit Date: 07/06/2019 PCP: Hoyt Koch, MD Referred by: Hoyt Koch, *  Subjective: Chief Complaint  Patient presents with  . Left Thigh - Pain   HPI:  Dorothy Armstrong is a 49 y.o. female who comes in today For planned repeat left L5-S1 interlaminar epidural steroid injection.  Prior injection in November was very successful with almost 100% relief for about a month.  We repeated the injection he continues to get some relief for a time but then the pain did return.  Since have seen her last she has had deep tissue massage on 2 occasions which seemed to actually help at first but then the pain has returned.  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  She essentially has a sacralized L5 segment based on that numbering scheme.  They elected to call that an L4-S1.  She does have a grade 1 listhesis here.  No nerve compression but she continues to get pain more of an L5 distribution into the hip and thigh.  This is on the left side.  She has had physical therapy as well as medication management.  We will repeat the injection today and I am going to prescribe Flexeril at night.  ROS Otherwise per HPI.  Assessment & Plan: Visit Diagnoses:  1. Lumbar radiculopathy   2. Spondylolisthesis of lumbar region     Plan: No additional findings.   Meds & Orders:  Meds ordered this encounter  Medications  . methylPREDNISolone acetate (DEPO-MEDROL) injection 40 mg  . cyclobenzaprine (FLEXERIL) 10 MG tablet    Sig: Take 1 tablet (10 mg total) by mouth at bedtime.    Dispense:  30 tablet    Refill:  2    Orders Placed This Encounter  Procedures  . XR C-ARM NO REPORT  . Epidural Steroid injection    Follow-up: Return if symptoms worsen or fail to improve.   Procedures: No procedures performed  Lumbar Epidural  Steroid Injection - Interlaminar Approach with Fluoroscopic Guidance  Patient: Dorothy Armstrong      Date of Birth: 1971-01-14 MRN: [id number] PCP: Hoyt Koch, MD      Visit Date: 07/06/2019   Universal Protocol:     Consent Given By: the patient  Position: PRONE  Additional Comments: Vital signs were monitored before and after the procedure. Patient was prepped and draped in the usual sterile fashion. The correct patient, procedure, and site was verified.   Injection Procedure Details:  Procedure Site One Meds Administered:  Meds ordered this encounter  Medications  . methylPREDNISolone acetate (DEPO-MEDROL) injection 40 mg  . cyclobenzaprine (FLEXERIL) 10 MG tablet    Sig: Take 1 tablet (10 mg total) by mouth at bedtime.    Dispense:  30 tablet    Refill:  2     Laterality: Left  Location/Site: She essentially has a transitional sacralized L5 segment.  Her MRI would call this an L4-S1 injection. L5-S1  Needle size: 20 G  Needle type: Tuohy  Needle Placement: Paramedian epidural  Findings:   -Comments: Excellent flow of contrast into the epidural space.  Procedure Details: Using a paramedian approach from the side mentioned above, the region overlying the inferior lamina was localized under fluoroscopic visualization and the soft tissues overlying this structure were infiltrated with 4 ml.  of 1% Lidocaine without Epinephrine. The Tuohy needle was inserted into the epidural space using a paramedian approach.   The epidural space was localized using loss of resistance along with lateral and bi-planar fluoroscopic views.  After negative aspirate for air, blood, and CSF, a 2 ml. volume of Isovue-250 was injected into the epidural space and the flow of contrast was observed. Radiographs were obtained for documentation purposes.    The injectate was administered into the level noted above.   Additional Comments:  The patient tolerated the procedure well  Dressing: 2 x 2 sterile gauze and Band-Aid    Post-procedure details: Patient was observed during the procedure. Post-procedure instructions were reviewed.  Patient left the clinic in stable condition.    Clinical History: MRI LUMBAR SPINE WITHOUT CONTRAST, 02/05/2019 2:40 PM  INDICATION: \ M51.36 DDD (degenerative disc disease), lumbar   COMPARISON: CT abdomen and pelvis of 01/30/2018  TECHNIQUE: Multiplanar, multisequence surface-coil magnetic resonance imaging of the lumbar spine was performed without contrast.  LEVELS IMAGED: Lower thoracic to the upper sacral region.  FINDINGS:   Transitional lumbosacral anatomy is present with a rudimentary S1-S2 disc space. There are 4 lumbar type nonrib-bearing vertebra. The disc space identified on axial image #40 will be referred to as the L4-S1 vertebra for the purposes of this examination.  T12-L1: Unremarkable.  L1-L2: Unremarkable.  L2-L3: Small right facet joint effusion.  L3-L4: Small right facet joint effusion.  L4-S1: Mild disc desiccation and loss of disc space height. Severe right and moderately severe left facet joint hypertrophy with grade I anterolisthesis of L4 on S1 contributes to moderate effacement of subarticular recesses and mild spinal canal stenosis without significant neural foraminal compromise.  The conus terminates at L1. Vertebral body alignment and vertebral body height are otherwise maintained throughout the lumbar spine with no spondylolisthesis or abnormality of marrow or spinal cord signal identified.  CONCLUSION: :  1. 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 2. Severe right and moderately severe left L4-S1 facet joint arthrosis with grade I anterolisthesis of L4 on S1 contribute to moderate effacement of subarticular recesses and mild acquired spinal canal stenosis at this level.  ----- LUMBAR SPINE - COMPLETE 4+ VIEW  COMPARISON: None.  FINDINGS: Five  lumbar type vertebral bodies show normal alignment at L4-5 and above, with 13 mm of anterolisthesis at L5-S1. Disc space narrowing L5-S1. Lower lumbar facet arthropathy, most pronounced at L5-S1. No evidence of pars defect. No fracture or focal lesion otherwise. IUD present within the central pelvis.  IMPRESSION: Degenerative anterolisthesis at L5-S1 of 13 mm. L5-S1 disc space narrowing. Findings at this level could certainly be painful.   Electronically Signed By: Nelson Chimes M.D. On: 07/02/2018 09:23     Objective:  VS:  HT:    WT:   BMI:     BP:138/86  HR:81bpm  TEMP: ( )  RESP:  Physical Exam  Ortho Exam Imaging: XR C-ARM NO REPORT  Result Date: 07/06/2019 Please see Notes tab for imaging impression.

## 2019-07-07 NOTE — Procedures (Signed)
Lumbar Epidural Steroid Injection - Interlaminar Approach with Fluoroscopic Guidance  Patient: Dorothy Armstrong      Date of Birth: 06-22-1970 MRN: [id number] PCP: Hoyt Koch, MD      Visit Date: 07/06/2019   Universal Protocol:     Consent Given By: the patient  Position: PRONE  Additional Comments: Vital signs were monitored before and after the procedure. Patient was prepped and draped in the usual sterile fashion. The correct patient, procedure, and site was verified.   Injection Procedure Details:  Procedure Site One Meds Administered:  Meds ordered this encounter  Medications  . methylPREDNISolone acetate (DEPO-MEDROL) injection 40 mg  . cyclobenzaprine (FLEXERIL) 10 MG tablet    Sig: Take 1 tablet (10 mg total) by mouth at bedtime.    Dispense:  30 tablet    Refill:  2     Laterality: Left  Location/Site: She essentially has a transitional sacralized L5 segment.  Her MRI would call this an L4-S1 injection. L5-S1  Needle size: 20 G  Needle type: Tuohy  Needle Placement: Paramedian epidural  Findings:   -Comments: Excellent flow of contrast into the epidural space.  Procedure Details: Using a paramedian approach from the side mentioned above, the region overlying the inferior lamina was localized under fluoroscopic visualization and the soft tissues overlying this structure were infiltrated with 4 ml. of 1% Lidocaine without Epinephrine. The Tuohy needle was inserted into the epidural space using a paramedian approach.   The epidural space was localized using loss of resistance along with lateral and bi-planar fluoroscopic views.  After negative aspirate for air, blood, and CSF, a 2 ml. volume of Isovue-250 was injected into the epidural space and the flow of contrast was observed. Radiographs were obtained for documentation purposes.    The injectate was administered into the level noted above.   Additional Comments:  The patient tolerated the  procedure well Dressing: 2 x 2 sterile gauze and Band-Aid    Post-procedure details: Patient was observed during the procedure. Post-procedure instructions were reviewed.  Patient left the clinic in stable condition.

## 2019-07-21 ENCOUNTER — Ambulatory Visit: Payer: Managed Care, Other (non HMO) | Admitting: Obstetrics & Gynecology

## 2019-07-21 ENCOUNTER — Telehealth: Payer: Self-pay | Admitting: *Deleted

## 2019-07-21 NOTE — Telephone Encounter (Signed)
Left message on voicemail to call and reschedule cancelled appointment. °

## 2019-08-07 ENCOUNTER — Other Ambulatory Visit: Payer: Self-pay | Admitting: Obstetrics & Gynecology

## 2019-08-07 ENCOUNTER — Telehealth: Payer: Self-pay | Admitting: Physical Medicine and Rehabilitation

## 2019-08-07 NOTE — Telephone Encounter (Signed)
Medication refill request: Paxil 20 mg daily Last AEX:  05/29/2019 Next AEX: 08/12/2020 Last MMG (if hormonal medication request): N/A Refill authorized: Paxil 20 mg daily #90 3RF pending  Patient canceled 8 week follow up 07/21/2019. Rx pending.

## 2019-08-07 NOTE — Telephone Encounter (Signed)
Needs auth for [phone] vs [phone]. Scheduled for 4/8.

## 2019-08-11 NOTE — Telephone Encounter (Signed)
Thank you for submitting your preauthorization request. The case has been sent to [employer] for further review.  If you have any questions please contact [employer] at [phone]. Case/AuthorizationService Order:[id number] Initiated Date:03/30/2021Case Activity:Submission in ProgressCase Status:Pending

## 2019-08-17 NOTE — Telephone Encounter (Signed)
Insurance denied injection please advise.

## 2019-08-18 NOTE — Telephone Encounter (Signed)
Show me the denial but have her come in anyway so we can talk

## 2019-08-18 NOTE — Telephone Encounter (Signed)
Called pt and advised per Dr. Ernestina Patches, pt is scheduled for OV 08/20/19.

## 2019-08-20 ENCOUNTER — Other Ambulatory Visit: Payer: Self-pay

## 2019-08-20 ENCOUNTER — Ambulatory Visit: Payer: Self-pay

## 2019-08-20 ENCOUNTER — Encounter: Payer: Self-pay | Admitting: Physical Medicine and Rehabilitation

## 2019-08-20 ENCOUNTER — Ambulatory Visit: Payer: Managed Care, Other (non HMO) | Admitting: Physical Medicine and Rehabilitation

## 2019-08-20 VITALS — BP 127/89 | HR 87 | Ht 62.0 in

## 2019-08-20 DIAGNOSIS — M5416 Radiculopathy, lumbar region: Secondary | ICD-10-CM | POA: Diagnosis not present

## 2019-08-20 DIAGNOSIS — M4316 Spondylolisthesis, lumbar region: Secondary | ICD-10-CM

## 2019-08-20 NOTE — Progress Notes (Signed)
 .  Numeric Pain Rating Scale and Functional Assessment Average Pain 7 Pain Right Now 2 My pain is intermittent, sharp and dull Pain is worse with: laying down Pain improves with: standing   In the last MONTH (on 0-10 scale) has pain interfered with the following?  1. General activity like being  able to carry out your everyday physical activities such as walking, climbing stairs, carrying groceries, or moving a chair?  Rating(8)  2. Relation with others like being able to carry out your usual social activities and roles such as  activities at home, at work and in your community. Rating(8)  3. Enjoyment of life such that you have  been bothered by emotional problems such as feeling anxious, depressed or irritable?  Rating(5)

## 2019-08-24 ENCOUNTER — Encounter: Payer: Self-pay | Admitting: Obstetrics & Gynecology

## 2019-08-24 NOTE — Progress Notes (Signed)
Dorothy Armstrong - 49 y.o. female MRN [id number]  Date of birth: 03-23-1971  Office Visit Note: Visit Date: 08/20/2019 PCP: Hoyt Koch, MD Referred by: Hoyt Koch, *  Subjective: Chief Complaint  Patient presents with  . Lower Back - Pain  . Left Thigh - Pain  . Left Lower Leg - Pain   HPI: Dorothy Armstrong is a 49 y.o. female who comes in today For evaluation and management of chronic worsening left low back and hip and leg pain.  Her notes are available from our prior visits and basically she has had this 1 year now of left low back and buttock and posterior lateral leg pain mainly in the thigh but occasionally further with some history of a small bit of paresthesia.  Her biggest complaint is at night trying to get to sleep and sometimes in the morning when awakening.  During the day she seemed to do okay.  She has had no focal weakness no bowel or bladder changes no specific trauma.  MRI showed listhesis of L5 on S1 with an S1 transitional type segment.  That particular MRI actually labeled at L4-S1.  Nonetheless epidural injection has been very beneficial diagnostically but it only seems to last for about a month.  These were interlaminar epidural injections.  Initially tried epidural injection at S1 without benefit and also facet joint block.  Because of the initial problems with doing injections diagnostically we ran into a situation where [place] and [employer] did not want to try another injection even though we were going to see if we can get some relief for her that is more long-lasting.  She has had physical therapy at [facility] physical therapy and she continues with some core strengthening.  She has had massage treatment.  We have tried medication with Tylenol 3 as well as Norco and Flexeril.  The Flexeril seems to help her some at night.  The pain medication in the past was not as beneficial as she had hoped.  We have thought about using gabapentin or Lyrica but have not  done that yet.  There is also some thought of switching her Paxil to Cymbalta.  She is present today with her husband who provides some of the history.  She rates her pain is quite severe averaging 7 out of 10 when is really painful particularly at night.  Is an intermittent sharp and dull pain.  She gets some improvement with standing more than sitting and moving.  She is also getting some spasming in the hamstring area.  Review of Systems  Musculoskeletal: Positive for back pain.  All other systems reviewed and are negative.  Otherwise per HPI.  Assessment & Plan: Visit Diagnoses:  1. Lumbar radiculopathy   2. Spondylolisthesis of lumbar region     Plan: Findings:  Chronic recalcitrant 1 year worth of left low back hip and leg pain secondary to degenerative arthritic spondylolisthesis of L5 on S1.  MRI did not show any focal nerve compression although there was some lateral recess narrowing.  Epidural injection from an interlaminar approach has been successful diagnostically and I think this is a nerve root type issue were dealing with.  I think she probably gets some pain from the facet joint as well but that was not very helpful with the intra-articular injection.  At this point consideration of starting Lyrica and perhaps seeing a chiropractor for manipulation.  Today I did complete diagnostic L5 transforaminal epidural steroid injection on the left.  I did this injection without charge just to be diagnostic and see if it would work any better.  If it does not help we will get a start Lyrica and maybe see Dr. Noberto Retort from a chiropractic standpoint.  We are at a point where she just had no longer term relief and it may be wise to seek out a second opinion may be from a neurosurgeon even though she does not really want to have surgery and really do not see any specific instance where it would be required.  She is going to continue with core exercises.  Could ultimately consider spinal cord  stimulator trial.    Meds & Orders: No orders of the defined types were placed in this encounter.   Orders Placed This Encounter  Procedures  . XR Lumb Spine Flex&Ext Only  . XR C-ARM NO REPORT    Follow-up: Return if symptoms worsen or fail to improve.   Procedures: No procedures performed  No notes on file   Clinical History: MRI LUMBAR SPINE WITHOUT CONTRAST, 02/05/2019 2:40 PM  INDICATION: \ M51.36 DDD (degenerative disc disease), lumbar   COMPARISON: CT abdomen and pelvis of 01/30/2018  TECHNIQUE: Multiplanar, multisequence surface-coil magnetic resonance imaging of the lumbar spine was performed without contrast.  LEVELS IMAGED: Lower thoracic to the upper sacral region.  FINDINGS:   Transitional lumbosacral anatomy is present with a rudimentary S1-S2 disc space. There are 4 lumbar type nonrib-bearing vertebra. The disc space identified on axial image #40 will be referred to as the L4-S1 vertebra for the purposes of this examination.  T12-L1: Unremarkable.  L1-L2: Unremarkable.  L2-L3: Small right facet joint effusion.  L3-L4: Small right facet joint effusion.  L4-S1: Mild disc desiccation and loss of disc space height. Severe right and moderately severe left facet joint hypertrophy with grade I anterolisthesis of L4 on S1 contributes to moderate effacement of subarticular recesses and mild spinal canal stenosis without significant neural foraminal compromise.  The conus terminates at L1. Vertebral body alignment and vertebral body height are otherwise maintained throughout the lumbar spine with no spondylolisthesis or abnormality of marrow or spinal cord signal identified.  CONCLUSION: :  1. 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 2. Severe right and moderately severe left L4-S1 facet joint arthrosis with grade I anterolisthesis of L4 on S1 contribute to moderate effacement of subarticular recesses and mild acquired spinal canal  stenosis at this level.  ----- LUMBAR SPINE - COMPLETE 4+ VIEW  COMPARISON: None.  FINDINGS: Five lumbar type vertebral bodies show normal alignment at L4-5 and above, with 13 mm of anterolisthesis at L5-S1. Disc space narrowing L5-S1. Lower lumbar facet arthropathy, most pronounced at L5-S1. No evidence of pars defect. No fracture or focal lesion otherwise. IUD present within the central pelvis.  IMPRESSION: Degenerative anterolisthesis at L5-S1 of 13 mm. L5-S1 disc space narrowing. Findings at this level could certainly be painful.   Electronically Signed By: Nelson Chimes M.D. On: 07/02/2018 09:23   She reports that she has never smoked. She has never used smokeless tobacco. No results for input(s): HGBA1C, LABURIC in the last 8760 hours.  Objective:  VS:  HT:5\' 2"  (157.5 cm)   WT:   BMI:     BP:127/89  HR:87bpm  TEMP: ( )  RESP:  Physical Exam Vitals and nursing note reviewed.  Constitutional:      General: She is not in acute distress.    Appearance: Normal appearance. She is well-developed. She is obese. She  is not ill-appearing.  HENT:     Head: Normocephalic and atraumatic.  Eyes:     Conjunctiva/sclera: Conjunctivae normal.     Pupils: Pupils are equal, round, and reactive to light.  Cardiovascular:     Rate and Rhythm: Normal rate.     Pulses: Normal pulses.  Pulmonary:     Effort: Pulmonary effort is normal.  Musculoskeletal:     Right lower leg: No edema.     Left lower leg: No edema.     Comments: Patient ambulate without aid with good distal strength.  No pain with hip rotation no pain over the greater trochanter.  Skin:    General: Skin is warm and dry.     Findings: No erythema or rash.  Neurological:     General: No focal deficit present.     Mental Status: She is alert and oriented to person, place, and time.     Sensory: No sensory deficit.     Motor: No weakness or abnormal muscle tone.     Coordination: Coordination normal.     Gait:  Gait normal.  Psychiatric:        Mood and Affect: Mood normal.        Behavior: Behavior normal.     Ortho Exam Imaging: No results found.  Past Medical/Family/Surgical/Social History: Medications & Allergies reviewed per EMR, new medications updated. Patient Active Problem List   Diagnosis Date Noted  . Spondylosis without myelopathy or radiculopathy, lumbar region 02/16/2019  . Spondylolisthesis of lumbar region 02/16/2019  . Chronic left-sided low back pain with left-sided sciatica 07/01/2018  . Right upper quadrant abdominal pain 09/20/2017  . Routine general medical examination at a health care facility 01/30/2016  . IUD (intrauterine device) in place 08/30/2015  . Fatty liver disease, nonalcoholic 0000000  . Obesity 11/08/2014  . Splenomegaly 11/08/2014  . Menorrhagia with regular cycle 08/01/2014  . OSTEOARTHRITIS 02/05/2008  . NEPHROLITHIASIS, HX OF 02/05/2008   Past Medical History:  Diagnosis Date  . Ankle fracture   . Arthritis    bilateral knee  . Hiatal hernia   . IBS (irritable bowel syndrome)   . Kidney calculi    kidney stone   Family History  Problem Relation Age of Onset  . Breast cancer Paternal Aunt   . Hypertension Father   . Hypertension Paternal Grandfather   . CVA Paternal Grandfather   . Colon cancer Paternal Grandmother    Past Surgical History:  Procedure Laterality Date  . CESAREAN SECTION  2001, 2004   x2  . CYSTOSCOPY/RETROGRADE/URETEROSCOPY/STONE EXTRACTION WITH BASKET  2001  . WISDOM TOOTH EXTRACTION     Social History   Occupational History  . Not on file  Tobacco Use  . Smoking status: Never Smoker  . Smokeless tobacco: Never Used  Substance and Sexual Activity  . Alcohol use: Yes    Alcohol/week: 1.0 standard drinks    Types: 1 Glasses of wine per week  . Drug use: No  . Sexual activity: Yes    Partners: Male    Birth control/protection: Other-see comments, I.U.D.    Comment: vasectomy

## 2019-08-28 ENCOUNTER — Telehealth: Payer: Self-pay | Admitting: *Deleted

## 2019-08-29 ENCOUNTER — Other Ambulatory Visit: Payer: Self-pay | Admitting: Physical Medicine and Rehabilitation

## 2019-08-29 MED ORDER — PREGABALIN 75 MG PO CAPS: ORAL_CAPSULE | ORAL | 1 refills | Status: DC

## 2019-08-29 NOTE — Telephone Encounter (Signed)
done

## 2019-08-29 NOTE — Progress Notes (Signed)
Lyrica rx see phone messages

## 2019-08-31 NOTE — Telephone Encounter (Signed)
Called pt and advised.  

## 2019-09-08 NOTE — Progress Notes (Signed)
GYNECOLOGY  VISIT  CC:   IUD recheck  HPI: 49 y.o. G2P2 Married White or Caucasian female here for iud recheck.  Doing well.  IUD was placed 05/29/2019.  Denies vaginal bleeding.  Having a lot of back issues.  Has done physical therapy.  Has received injections.  Going to see a chiropractor next.  Is on Lyrica.  Last day of school is May 27th for her.    Glucose was elevated with last work.  hbA1C recommended.  Pt will have lab drawn today.  GYNECOLOGIC HISTORY: No LMP recorded. (Menstrual status: IUD). Contraception: mirena iud placed 05-29-2019 Menopausal hormone therapy: none  Patient Active Problem List   Diagnosis Date Noted  . Spondylosis without myelopathy or radiculopathy, lumbar region 02/16/2019  . Spondylolisthesis of lumbar region 02/16/2019  . Chronic left-sided low back pain with left-sided sciatica 07/01/2018  . Right upper quadrant abdominal pain 09/20/2017  . Routine general medical examination at a health care facility 01/30/2016  . IUD (intrauterine device) in place 08/30/2015  . Fatty liver disease, nonalcoholic 0000000  . Obesity 11/08/2014  . Splenomegaly 11/08/2014  . Menorrhagia with regular cycle 08/01/2014  . OSTEOARTHRITIS 02/05/2008  . NEPHROLITHIASIS, HX OF 02/05/2008    Past Medical History:  Diagnosis Date  . Ankle fracture   . Arthritis    bilateral knee  . Hiatal hernia   . IBS (irritable bowel syndrome)   . Kidney calculi    kidney stone    Past Surgical History:  Procedure Laterality Date  . CESAREAN SECTION  2001, 2004   x2  . CYSTOSCOPY/RETROGRADE/URETEROSCOPY/STONE EXTRACTION WITH BASKET  2001  . WISDOM TOOTH EXTRACTION      MEDS:   Current Outpatient Medications on File Prior to Visit  Medication Sig Dispense Refill  . glucosamine-chondroitin 500-400 MG tablet Take 1 tablet by mouth 3 (three) times daily.    . Multiple Vitamins-Minerals (MULTIVITAMIN PO) Take by mouth daily.    Marland Kitchen PARoxetine (PAXIL) 20 MG tablet TAKE 1  TABLET BY MOUTH EVERY DAY 90 tablet 3  . pregabalin (LYRICA) 75 MG capsule Take 1 capsule (75 mg total) by mouth at bedtime for 7 days, THEN 1 capsule (75 mg total) 2 (two) times daily for 23 days. 60 capsule 1  . Vitamin D, Ergocalciferol, (DRISDOL) 50000 units CAPS capsule Take 1 capsule (50,000 Units total) by mouth 2 (two) times a week. 24 capsule 0   No current facility-administered medications on file prior to visit.    ALLERGIES: Sulfonamide derivatives  Family History  Problem Relation Age of Onset  . Breast cancer Paternal Aunt   . Hypertension Father   . Hypertension Paternal Grandfather   . CVA Paternal Grandfather   . Colon cancer Paternal Grandmother     SH:  Married, non smoker  Review of Systems  Constitutional: Negative.   HENT: Negative.   Eyes: Negative.   Respiratory: Negative.   Cardiovascular: Negative.   Gastrointestinal: Negative.   Endocrine: Negative.   Genitourinary: Negative.   Musculoskeletal: Negative.   Skin: Negative.   Allergic/Immunologic: Negative.   Neurological: Negative.   Psychiatric/Behavioral: Negative.     PHYSICAL EXAMINATION:    BP 122/76   Pulse 70   Temp 97.9 F (36.6 C) (Skin)   Resp 16   Wt 238 lb (108 kg)   BMI 43.53 kg/m     General appearance: alert, cooperative and appears stated age Lymph:  no inguinal LAD noted  Pelvic: External genitalia:  no lesions  Urethra:  normal appearing urethra with no masses, tenderness or lesions              Bartholins and Skenes: normal                 Vagina: normal appearing vagina with normal color and discharge, no lesions              Cervix: no lesions and IUD string noted              Bimanual Exam:  Uterus:  normal size, contour, position, consistency, mobility, non-tender              Adnexa: no mass, fullness, tenderness  Chaperone, Royal Hawthorn, CMA, was present for exam.  Assessment: Mirena IUD recheck Elevated blood glucose  Plan: HbA1C obtained  today Return for AEX

## 2019-09-14 ENCOUNTER — Ambulatory Visit: Payer: Managed Care, Other (non HMO) | Admitting: Obstetrics & Gynecology

## 2019-09-14 ENCOUNTER — Encounter: Payer: Self-pay | Admitting: Obstetrics & Gynecology

## 2019-09-14 ENCOUNTER — Other Ambulatory Visit: Payer: Self-pay

## 2019-09-14 VITALS — BP 122/76 | HR 70 | Temp 97.9°F | Resp 16 | Wt 238.0 lb

## 2019-09-14 DIAGNOSIS — R7309 Other abnormal glucose: Secondary | ICD-10-CM | POA: Diagnosis not present

## 2019-09-14 DIAGNOSIS — Z30431 Encounter for routine checking of intrauterine contraceptive device: Secondary | ICD-10-CM

## 2019-09-15 LAB — HEMOGLOBIN A1C
Est. average glucose Bld gHb Est-mCnc: 117 mg/dL
Hgb A1c MFr Bld: 5.7 % — ABNORMAL HIGH (ref 4.8–5.6)

## 2019-10-13 ENCOUNTER — Telehealth: Payer: Self-pay | Admitting: *Deleted

## 2019-10-13 NOTE — Telephone Encounter (Signed)
Service FX:[id number] Initiated Date:06/01/2021Case Activity:Nurse Review ProcessCase Status:Pending

## 2019-10-14 ENCOUNTER — Other Ambulatory Visit (INDEPENDENT_AMBULATORY_CARE_PROVIDER_SITE_OTHER): Payer: Self-pay | Admitting: Physical Medicine and Rehabilitation

## 2019-10-14 NOTE — Telephone Encounter (Signed)
Please advise 

## 2019-10-16 ENCOUNTER — Telehealth: Payer: Self-pay | Admitting: *Deleted

## 2019-10-16 NOTE — Telephone Encounter (Signed)
Received fax authorization for [phone]  Auth# [id number] Effective dates 10/14/19-8-31/21

## 2019-10-21 ENCOUNTER — Encounter: Payer: Self-pay | Admitting: Physical Medicine and Rehabilitation

## 2019-10-21 ENCOUNTER — Ambulatory Visit: Payer: Self-pay

## 2019-10-21 ENCOUNTER — Ambulatory Visit: Payer: Managed Care, Other (non HMO) | Admitting: Physical Medicine and Rehabilitation

## 2019-10-21 ENCOUNTER — Other Ambulatory Visit: Payer: Self-pay

## 2019-10-21 VITALS — BP 128/85 | HR 81

## 2019-10-21 DIAGNOSIS — M4316 Spondylolisthesis, lumbar region: Secondary | ICD-10-CM

## 2019-10-21 DIAGNOSIS — M5416 Radiculopathy, lumbar region: Secondary | ICD-10-CM | POA: Diagnosis not present

## 2019-10-21 MED ORDER — METHYLPREDNISOLONE ACETATE 80 MG/ML IJ SUSP
80.0000 mg | Freq: Once | INTRAMUSCULAR | Status: AC
  Administered 2019-10-21: 80 mg

## 2019-10-21 MED ORDER — PREGABALIN 75 MG PO CAPS: 75.0000 mg | ORAL_CAPSULE | Freq: Two times a day (BID) | ORAL | 1 refills | Status: DC

## 2019-10-21 NOTE — Progress Notes (Signed)
Pt states pain in the lower back on the left side that radiates into the left leg all the way down. Pt states last injection did not help at all. Pt states laying down makes pain worse. Standing makes pain better.  .Numeric Pain Rating Scale and Functional Assessment Average Pain 8   In the last MONTH (on 0-10 scale) has pain interfered with the following?  1. General activity like being  able to carry out your everyday physical activities such as walking, climbing stairs, carrying groceries, or moving a chair?  Rating(8)   +Driver, -BT, -Dye Allergies.

## 2019-10-21 NOTE — Progress Notes (Signed)
Dorothy Armstrong - 49 y.o. female MRN [id number]  Date of birth: 10/14/1970  Office Visit Note: Visit Date: 10/21/2019 PCP: Hoyt Koch, MD Referred by: Hoyt Koch, *  Subjective: Chief Complaint  Patient presents with  . Lower Back - Pain  . Left Leg - Pain   HPI: Dorothy Armstrong is a 49 y.o. female who comes in today for planned repeat Left L5-S1 (MRI designates L4-S1 with transitional segment )lumbar epidural steroid injection with fluoroscopic guidance.  The patient has failed conservative care including home exercise, medications, time and activity modification.  This injection will be diagnostic and hopefully therapeutic.  Please see requesting physician notes for further details and justification. Patient received more than 50% pain relief from prior injection.   Referring: Myself   ROS Otherwise per HPI.  Assessment & Plan: Visit Diagnoses:  1. Lumbar radiculopathy   2. Spondylolisthesis of lumbar region     Plan: No additional findings.   Meds & Orders:  Meds ordered this encounter  Medications  . methylPREDNISolone acetate (DEPO-MEDROL) injection 80 mg  . pregabalin (LYRICA) 75 MG capsule    Sig: Take 1 capsule (75 mg total) by mouth 2 (two) times daily.    Dispense:  60 capsule    Refill:  1    Orders Placed This Encounter  Procedures  . XR C-ARM NO REPORT  . Ambulatory referral to Neurosurgery  . Epidural Steroid injection    Follow-up: Return if symptoms worsen or fail to improve.   Procedures: No procedures performed  Lumbar Epidural Steroid Injection - Interlaminar Approach with Fluoroscopic Guidance  Patient: Dorothy Armstrong      Date of Birth: [birth year] MRN: [id number] PCP: Hoyt Koch, MD      Visit Date: 10/21/2019   Universal Protocol:     Consent Given By: the patient  Position: PRONE  Additional Comments: Vital signs were monitored before and after the procedure. Patient was prepped and draped in the usual  sterile fashion. The correct patient, procedure, and site was verified.   Injection Procedure Details:  Procedure Site One Meds Administered:  Meds ordered this encounter  Medications  . methylPREDNISolone acetate (DEPO-MEDROL) injection 80 mg  . pregabalin (LYRICA) 75 MG capsule    Sig: Take 1 capsule (75 mg total) by mouth 2 (two) times daily.    Dispense:  60 capsule    Refill:  1     Laterality: Left  Location/Site: MRI labels lowest segment is L4-S1. L5-S1  Needle size: 20 G  Needle type: Tuohy  Needle Placement: Paramedian epidural  Findings:   -Comments: Excellent flow of contrast into the epidural space.  Procedure Details: Using a paramedian approach from the side mentioned above, the region overlying the inferior lamina was localized under fluoroscopic visualization and the soft tissues overlying this structure were infiltrated with 4 ml. of 1% Lidocaine without Epinephrine. The Tuohy needle was inserted into the epidural space using a paramedian approach.   The epidural space was localized using loss of resistance along with lateral and bi-planar fluoroscopic views.  After negative aspirate for air, blood, and CSF, a 2 ml. volume of Isovue-250 was injected into the epidural space and the flow of contrast was observed. Radiographs were obtained for documentation purposes.    The injectate was administered into the level noted above.   Additional Comments:  The patient tolerated the procedure well Dressing: 2 x 2 sterile gauze and Band-Aid    Post-procedure details: Patient was  observed during the procedure. Post-procedure instructions were reviewed.  Patient left the clinic in stable condition.    Clinical History: MRI LUMBAR SPINE WITHOUT CONTRAST, 02/05/2019 2:40 PM  INDICATION: \ M51.36 DDD (degenerative disc disease), lumbar   COMPARISON: CT abdomen and pelvis of 01/30/2018  TECHNIQUE: Multiplanar, multisequence surface-coil magnetic resonance  imaging of the lumbar spine was performed without contrast.  LEVELS IMAGED: Lower thoracic to the upper sacral region.  FINDINGS:   Transitional lumbosacral anatomy is present with a rudimentary S1-S2 disc space. There are 4 lumbar type nonrib-bearing vertebra. The disc space identified on axial image #40 will be referred to as the L4-S1 vertebra for the purposes of this examination.  T12-L1: Unremarkable.  L1-L2: Unremarkable.  L2-L3: Small right facet joint effusion.  L3-L4: Small right facet joint effusion.  L4-S1: Mild disc desiccation and loss of disc space height. Severe right and moderately severe left facet joint hypertrophy with grade I anterolisthesis of L4 on S1 contributes to moderate effacement of subarticular recesses and mild spinal canal stenosis without significant neural foraminal compromise.  The conus terminates at L1. Vertebral body alignment and vertebral body height are otherwise maintained throughout the lumbar spine with no spondylolisthesis or abnormality of marrow or spinal cord signal identified.  CONCLUSION: :  1. 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 2. Severe right and moderately severe left L4-S1 facet joint arthrosis with grade I anterolisthesis of L4 on S1 contribute to moderate effacement of subarticular recesses and mild acquired spinal canal stenosis at this level.  ----- LUMBAR SPINE - COMPLETE 4+ VIEW  COMPARISON: None.  FINDINGS: Five lumbar type vertebral bodies show normal alignment at L4-5 and above, with 13 mm of anterolisthesis at L5-S1. Disc space narrowing L5-S1. Lower lumbar facet arthropathy, most pronounced at L5-S1. No evidence of pars defect. No fracture or focal lesion otherwise. IUD present within the central pelvis.  IMPRESSION: Degenerative anterolisthesis at L5-S1 of 13 mm. L5-S1 disc space narrowing. Findings at this level could certainly be painful.   Electronically  Signed By: Nelson Chimes M.D. On: 07/02/2018 09:23   She reports that she has never smoked. She has never used smokeless tobacco.  Recent Labs    09/14/19 1646  HGBA1C 5.7*    Objective:  VS:  HT:    WT:   BMI:     BP:128/85  HR:81bpm  TEMP: ( )  RESP:  Physical Exam Constitutional:      General: She is not in acute distress.    Appearance: Normal appearance. She is not ill-appearing.  HENT:     Head: Normocephalic and atraumatic.     Right Ear: External ear normal.     Left Ear: External ear normal.  Eyes:     Extraocular Movements: Extraocular movements intact.  Cardiovascular:     Rate and Rhythm: Normal rate.     Pulses: Normal pulses.  Musculoskeletal:     Right lower leg: No edema.     Left lower leg: No edema.     Comments: Patient has good distal strength with no pain over the greater trochanters.  No clonus or focal weakness.  Skin:    Findings: No erythema, lesion or rash.  Neurological:     General: No focal deficit present.     Mental Status: She is alert and oriented to person, place, and time.     Sensory: No sensory deficit.     Motor: No weakness or abnormal muscle tone.     Coordination:  Coordination normal.  Psychiatric:        Mood and Affect: Mood normal.        Behavior: Behavior normal.     Ortho Exam  Imaging: No results found.  Past Medical/Family/Surgical/Social History: Medications & Allergies reviewed per EMR, new medications updated. Patient Active Problem List   Diagnosis Date Noted  . Spondylosis without myelopathy or radiculopathy, lumbar region 02/16/2019  . Spondylolisthesis of lumbar region 02/16/2019  . Chronic left-sided low back pain with left-sided sciatica 07/01/2018  . Right upper quadrant abdominal pain 09/20/2017  . Routine general medical examination at a health care facility 01/30/2016  . IUD (intrauterine device) in place 08/30/2015  . Fatty liver disease, nonalcoholic 67/54/4920  . Obesity 11/08/2014  .  Splenomegaly 11/08/2014  . Menorrhagia with regular cycle 08/01/2014  . OSTEOARTHRITIS 02/05/2008  . NEPHROLITHIASIS, HX OF 02/05/2008   Past Medical History:  Diagnosis Date  . Ankle fracture   . Arthritis    bilateral knee  . Hiatal hernia   . IBS (irritable bowel syndrome)   . Kidney calculi    kidney stone   Family History  Problem Relation Age of Onset  . Breast cancer Paternal Aunt   . Hypertension Father   . Hypertension Paternal Grandfather   . CVA Paternal Grandfather   . Colon cancer Paternal Grandmother    Past Surgical History:  Procedure Laterality Date  . CESAREAN SECTION  2001, 2004   x2  . CYSTOSCOPY/RETROGRADE/URETEROSCOPY/STONE EXTRACTION WITH BASKET  2001  . WISDOM TOOTH EXTRACTION     Social History   Occupational History  . Not on file  Tobacco Use  . Smoking status: Never Smoker  . Smokeless tobacco: Never Used  Vaping Use  . Vaping Use: Never used  Substance and Sexual Activity  . Alcohol use: Yes    Alcohol/week: 1.0 standard drink    Types: 1 Glasses of wine per week  . Drug use: No  . Sexual activity: Yes    Partners: Male    Birth control/protection: Other-see comments, I.U.D.    Comment: vasectomy

## 2019-10-29 NOTE — Procedures (Signed)
Lumbar Epidural Steroid Injection - Interlaminar Approach with Fluoroscopic Guidance  Patient: Dorothy Armstrong      Date of Birth: April 01, 1971 MRN: [id number] PCP: Hoyt Koch, MD      Visit Date: 10/21/2019   Universal Protocol:     Consent Given By: the patient  Position: PRONE  Additional Comments: Vital signs were monitored before and after the procedure. Patient was prepped and draped in the usual sterile fashion. The correct patient, procedure, and site was verified.   Injection Procedure Details:  Procedure Site One Meds Administered:  Meds ordered this encounter  Medications  . methylPREDNISolone acetate (DEPO-MEDROL) injection 80 mg  . pregabalin (LYRICA) 75 MG capsule    Sig: Take 1 capsule (75 mg total) by mouth 2 (two) times daily.    Dispense:  60 capsule    Refill:  1     Laterality: Left  Location/Site: MRI labels lowest segment is L4-S1. L5-S1  Needle size: 20 G  Needle type: Tuohy  Needle Placement: Paramedian epidural  Findings:   -Comments: Excellent flow of contrast into the epidural space.  Procedure Details: Using a paramedian approach from the side mentioned above, the region overlying the inferior lamina was localized under fluoroscopic visualization and the soft tissues overlying this structure were infiltrated with 4 ml. of 1% Lidocaine without Epinephrine. The Tuohy needle was inserted into the epidural space using a paramedian approach.   The epidural space was localized using loss of resistance along with lateral and bi-planar fluoroscopic views.  After negative aspirate for air, blood, and CSF, a 2 ml. volume of Isovue-250 was injected into the epidural space and the flow of contrast was observed. Radiographs were obtained for documentation purposes.    The injectate was administered into the level noted above.   Additional Comments:  The patient tolerated the procedure well Dressing: 2 x 2 sterile gauze and Band-Aid     Post-procedure details: Patient was observed during the procedure. Post-procedure instructions were reviewed.  Patient left the clinic in stable condition.

## 2019-12-18 ENCOUNTER — Telehealth: Payer: Self-pay | Admitting: Internal Medicine

## 2019-12-18 NOTE — Telephone Encounter (Signed)
New message:   Pt is calling and thinks she may have a clot in her lower left leg. She states she had a procedure with the neurosurgeon but it's not the same area. I have transferred the pt to  [facility] and she is being to [facility].

## 2019-12-21 ENCOUNTER — Other Ambulatory Visit: Payer: Self-pay

## 2019-12-21 ENCOUNTER — Encounter (HOSPITAL_COMMUNITY): Payer: Self-pay

## 2019-12-21 ENCOUNTER — Ambulatory Visit (HOSPITAL_COMMUNITY)
Admission: RE | Admit: 2019-12-21 | Discharge: 2019-12-21 | Disposition: A | Discharge status: Home / Self Care | Payer: Managed Care, Other (non HMO) | Source: Ambulatory Visit

## 2019-12-21 VITALS — BP 143/95 | HR 77 | Temp 98.1°F | Resp 16

## 2019-12-21 DIAGNOSIS — M79662 Pain in left lower leg: Secondary | ICD-10-CM | POA: Diagnosis not present

## 2019-12-21 NOTE — ED Triage Notes (Addendum)
2 months ago noticed pain in left calf, "charlie horse" Patient had back surgery 7/30  Surgery took care of pain that she had otherwise.   Left lower leg/calf is more painful.  Patient states she wants to rule out DVT

## 2019-12-21 NOTE — ED Provider Notes (Signed)
[facility]    CSN: [id number] Arrival date & time: 12/21/19  1057     History   Chief Complaint Chief Complaint  Patient presents with  . Leg Pain    HPI Dorothy Armstrong is a 49 y.o. female.   HPI Patient presents with a concern for left calf pain.  No known injury. She reports calf cramping in the left calf for more than 2 months intermittently. She recently underwent a low lumbar surgical procedure outpatient which fixed the neuropathic sciatic-like pain that was radiating into her left leg. Her current symptoms consist of a dull ache to dull cramping of the left calf. She is not having any swelling of her left extremity or left ankle. There is no bruising or palpable tenderness. No history of hypercoagulability, no OCPs, she is a non-smoker. 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 No recent extended travel. He is scheduled to follow-up with her neurologist in about 2 days for a post surgery follow-up. She reports great success with her recent lumbar procedure.  Past Medical History:  Diagnosis Date  . Ankle fracture   . Arthritis    bilateral knee  . Hiatal hernia   . IBS (irritable bowel syndrome)   . Kidney calculi    kidney stone    Patient Active Problem List   Diagnosis Date Noted  . Spondylosis without myelopathy or radiculopathy, lumbar region 02/16/2019  . Spondylolisthesis of lumbar region 02/16/2019  . Chronic left-sided low back pain with left-sided sciatica 07/01/2018  . Right upper quadrant abdominal pain 09/20/2017  . Routine general medical examination at a health care facility 01/30/2016  . IUD (intrauterine device) in place 08/30/2015  . Fatty liver disease, nonalcoholic 25/09/3974  . Obesity 11/08/2014  . Splenomegaly 11/08/2014  . Menorrhagia with regular cycle 08/01/2014  . OSTEOARTHRITIS 02/05/2008  . NEPHROLITHIASIS, HX OF 02/05/2008    Past Surgical History:  Procedure Laterality Date  . BACK SURGERY    .  CESAREAN SECTION  2001, 2004   x2  . CYSTOSCOPY/RETROGRADE/URETEROSCOPY/STONE EXTRACTION WITH BASKET  2001  . WISDOM TOOTH EXTRACTION      OB History    Gravida  2   Para  2   Term      Preterm      AB      Living  2     SAB      TAB      Ectopic      Multiple      Live Births               Home Medications    Prior to Admission medications   Medication Sig Start Date End Date Taking? Authorizing Provider  PARoxetine (PAXIL) 20 MG tablet TAKE 1 TABLET BY MOUTH EVERY DAY 08/07/19  Yes Megan Salon, MD  cyclobenzaprine (FLEXERIL) 10 MG tablet Take 10 mg by mouth at bedtime. 10/14/19   [provider]  glucosamine-chondroitin 500-400 MG tablet Take 1 tablet by mouth 3 (three) times daily.    [provider]  HYDROcodone-acetaminophen (NORCO/VICODIN) 5-325 MG tablet Take 1 tablet by mouth every 6 (six) hours as needed. 12/11/19   [provider]  meloxicam (MOBIC) 15 MG tablet TAKE 1 TABLET (15 MG TOTAL) BY MOUTH DAILY. TAKE WITH FOOD, START AFTER PREDNISONE FINISHED. 10/14/19   Magnus Sinning, MD  methocarbamol (ROBAXIN) 500 MG tablet Take 500 mg by mouth 4 (four) times daily. 12/11/19   [provider]  Multiple Vitamins-Minerals (MULTIVITAMIN PO) Take by mouth daily.    [provider]  pregabalin (LYRICA) 75 MG capsule Take 1 capsule (75 mg total) by mouth 2 (two) times daily. 10/21/19   Magnus Sinning, MD  Vitamin D, Ergocalciferol, (DRISDOL) 50000 units CAPS capsule Take 1 capsule (50,000 Units total) by mouth 2 (two) times a week. 01/30/18   Megan Salon, MD    Family History Family History  Problem Relation Age of Onset  . Breast cancer Paternal Aunt   . Hypertension Father   . Hypertension Paternal Grandfather   . CVA Paternal Grandfather   . Colon cancer Paternal Grandmother     Social History Social History   Tobacco Use  . Smoking status: Never Smoker  . Smokeless tobacco: Never Used  Vaping Use  .  Vaping Use: Never used  Substance Use Topics  . Alcohol use: Yes    Alcohol/week: 1.0 standard drink    Types: 1 Glasses of wine per week  . Drug use: No     Allergies   Sulfonamide derivatives  Review of Systems Review of Systems Pertinent negatives listed in HPI  Physical Exam Triage Vital Signs ED Triage Vitals [12/21/19 1135]  Enc Vitals Group     BP (!) 143/95     Pulse Rate 77     Resp 16     Temp 98.1 F (36.7 C)     Temp Source Oral     SpO2 97 %     Weight      Height      Head Circumference      Peak Flow      Pain Score      Pain Loc      Pain Edu?      Excl. in [place]?    No data found.  Updated Vital Signs BP (!) 143/95 (BP Location: Left Arm)   Pulse 77   Temp 98.1 F (36.7 C) (Oral)   Resp 16   SpO2 97%   Visual Acuity Right Eye Distance:   Left Eye Distance:   Bilateral Distance:    Right Eye Near:   Left Eye Near:    Bilateral Near:     Physical Exam General appearance: alert, well developed, well nourished, cooperative and in no distress Head: Normocephalic, without obvious abnormality, atraumatic Respiratory: Respirations even and unlabored, normal respiratory rate Heart: rate and rhythm normal. No gallop or murmurs noted on exam  Abdomen: BS +, no distention, no rebound tenderness, or no mass Extremities: BLE, no edema or swelling , No reproducible tenderness, no palpable mass, no bruising or ecchymosis   Skin: Skin color, texture, turgor normal. No rashes seen  Psych: Appropriate mood and affect. Neurologic: Mental status: Alert, oriented to person, place, and time, thought content appropriate.  UC Treatments / Results  Labs (all labs ordered are listed, but only abnormal results are displayed) Labs Reviewed - No data to display  EKG   Radiology No results found.  Procedures Procedures (including critical care time)  Medications Ordered in UC Medications - No data to display  Initial Impression / Assessment and Plan /  UC Course  I have reviewed the triage vital signs and the nursing notes.  Pertinent labs & imaging results that were available during my care of the patient were reviewed by me and considered in my medical decision making (see chart for details).    Exam findings reassuring against a probability of a DVT. Patient has  no swelling and no palpable tenderness of left extremity. Negative Homans' sign. Pain occurs only at nighttime. Patient has a history of chronically low vitamin D. Encouraged to have this level rechecked as she may need an extended course of high-dose vitamin D replacement which may also relieve her pain. Encouraged use of a compression stocking at bedtime to see if this helps with pain. Keep follow-up with neurologist in 2 days to possibly discuss a work-up for possible restless leg syndrome. Red flags discussed. Final Clinical Impressions(s) / UC Diagnoses   Final diagnoses:  Pain of left calf, intermittent >2 months     Discharge Instructions     Discuss with your neurologist possible post-surgical related side effects that could be causing left calf pain. Also discuss possible work-up for restless leg syndrome Continue home vitamin D and follow-up with Primary Care provider, to have vitamin D level repeated. If swelling, skin discoloration, severe pain develops, go immediately to the emergency department. I recommended trial wearing a compression stocking at bedtime to evaluation if this measure will improve pain control at bedtime.     ED Prescriptions    None     PDMP not reviewed this encounter.   Scot Jun, FNP 12/21/19 [phone]

## 2019-12-21 NOTE — Telephone Encounter (Signed)
Per [facility] note, She has been left leg pain for a while. She had surg and the pain was much better. Since the surg last Friday the pain in now in her calf and the pain is different in that is a dull pain vs a sharp pain.  Caller states she has had left leg pain for while. has been having left calf pain for a few days. had spinal surgery last Friday. no swelling or redness  User: Dorothy Sou, RN Date/Time [name] Time): 12/18/2019 11:33:34 AM  this call was unlocked RN had lost ISP service and could not completer the call Triage was completed and she was attempting to reach the office to schedule the appointment  User: Dannielle Burn, RN Date/Time [name] Time): 12/18/2019 11:50:48 AM  Called back line and no appts are available for today. Advised pt to go to urgent care or the ER. States she will call her surgeon

## 2019-12-21 NOTE — Discharge Instructions (Addendum)
Discuss with your neurologist possible post-surgical related side effects that could be causing left calf pain. Also discuss possible work-up for restless leg syndrome Continue home vitamin D and follow-up with Primary Care provider, to have vitamin D level repeated. If swelling, skin discoloration, severe pain develops, go immediately to the emergency department. I recommended trial wearing a compression stocking at bedtime to evaluation if this measure will improve pain control at bedtime.

## 2020-06-08 ENCOUNTER — Encounter: Payer: Self-pay | Admitting: Gastroenterology

## 2020-07-25 ENCOUNTER — Other Ambulatory Visit: Payer: Self-pay

## 2020-07-25 ENCOUNTER — Ambulatory Visit (AMBULATORY_SURGERY_CENTER): Payer: Managed Care, Other (non HMO)

## 2020-07-25 VITALS — Ht 62.5 in | Wt 230.0 lb

## 2020-07-25 DIAGNOSIS — Z1211 Encounter for screening for malignant neoplasm of colon: Secondary | ICD-10-CM

## 2020-07-25 MED ORDER — NA SULFATE-K SULFATE-MG SULF 17.5-3.13-1.6 GM/177ML PO SOLN
1.0000 | Freq: Once | ORAL | 0 refills | Status: AC
Start: 2020-07-25 — End: 2020-07-25

## 2020-07-25 NOTE — Progress Notes (Signed)
Pre visit completed via phone; patient verified name, DOB, and address;  No egg or soy allergy known to patient  No issues with past sedation with any surgeries or procedures No intubation problems in the past  No FH of Malignant Hyperthermia No diet pills per patient No home 02 use per patient  No blood thinners per patient  Pt denies issues with constipation  No A fib or A flutter  EMMI video via [facility] 19 guidelines implemented in PV today with Pt and RN  Coupon given to pt in PV today, Code to Pharmacy and  NO PA's for preps discussed with pt in PV today  Discussed with pt there will be an out-of-pocket cost for prep and that varies from $0 to 70 dollars;  Due to the COVID-19 pandemic we are asking patients to follow certain guidelines.   Pt aware of COVID protocols and [facility] guidelines

## 2020-08-01 ENCOUNTER — Encounter: Payer: Self-pay | Admitting: Gastroenterology

## 2020-08-08 ENCOUNTER — Ambulatory Visit (AMBULATORY_SURGERY_CENTER): Payer: Managed Care, Other (non HMO) | Admitting: Gastroenterology

## 2020-08-08 ENCOUNTER — Encounter: Payer: Self-pay | Admitting: Gastroenterology

## 2020-08-08 ENCOUNTER — Other Ambulatory Visit: Payer: Self-pay

## 2020-08-08 VITALS — BP 116/80 | HR 73 | Temp 97.3°F | Resp 14 | Ht 62.25 in | Wt 230.0 lb

## 2020-08-08 DIAGNOSIS — D122 Benign neoplasm of ascending colon: Secondary | ICD-10-CM

## 2020-08-08 DIAGNOSIS — D120 Benign neoplasm of cecum: Secondary | ICD-10-CM

## 2020-08-08 DIAGNOSIS — Z1211 Encounter for screening for malignant neoplasm of colon: Secondary | ICD-10-CM

## 2020-08-08 MED ORDER — SODIUM CHLORIDE 0.9 % IV SOLN: 500.0000 mL | Freq: Once | INTRAVENOUS | Status: DC

## 2020-08-08 NOTE — Patient Instructions (Signed)
Please read handouts provided. Continue present medications. Await pathology results.   YOU HAD AN ENDOSCOPIC PROCEDURE TODAY AT THE [facility]:   Refer to the procedure report that was given to you for any specific questions about what was found during the examination.  If the procedure report does not answer your questions, please call your gastroenterologist to clarify.  If you requested that your care partner not be given the details of your procedure findings, then the procedure report has been included in a sealed envelope for you to review at your convenience later.  YOU SHOULD EXPECT: Some feelings of bloating in the abdomen. Passage of more gas than usual.  Walking can help get rid of the air that was put into your GI tract during the procedure and reduce the bloating. If you had a lower endoscopy (such as a colonoscopy or flexible sigmoidoscopy) you may notice spotting of blood in your stool or on the toilet paper. If you underwent a bowel prep for your procedure, you may not have a normal bowel movement for a few days.  Please Note:  You might notice some irritation and congestion in your nose or some drainage.  This is from the oxygen used during your procedure.  There is no need for concern and it should clear up in a day or so.  SYMPTOMS TO REPORT IMMEDIATELY:  Following lower endoscopy (colonoscopy or flexible sigmoidoscopy):  Excessive amounts of blood in the stool  Significant tenderness or worsening of abdominal pains  Swelling of the abdomen that is new, acute  Fever of 100F or higher   For urgent or emergent issues, a gastroenterologist can be reached at any hour by calling [phone]. Do not use MyChart messaging for urgent concerns.    DIET:  We do recommend a small meal at first, but then you may proceed to your regular diet.  Drink plenty of fluids but you should avoid alcoholic beverages for 24 hours.  ACTIVITY:  You should plan to take it easy  for the rest of today and you should NOT DRIVE or use heavy machinery until tomorrow (because of the sedation medicines used during the test).    FOLLOW UP: Our staff will call the number listed on your records 48-72 hours following your procedure to check on you and address any questions or concerns that you may have regarding the information given to you following your procedure. If we do not reach you, we will leave a message.  We will attempt to reach you two times.  During this call, we will ask if you have developed any symptoms of COVID 19. If you develop any symptoms (ie: fever, flu-like symptoms, shortness of breath, cough etc.) before then, please call [phone].  If you test positive for Covid 19 in the 2 weeks post procedure, please call and report this information to us.    If any biopsies were taken you will be contacted by phone or by letter within the next 1-3 weeks.  Please call us at [phone] if you have not heard about the biopsies in 3 weeks.    SIGNATURES/CONFIDENTIALITY: You and/or your care partner have signed paperwork which will be entered into your electronic medical record.  These signatures attest to the fact that that the information above on your After Visit Summary has been reviewed and is understood.  Full responsibility of the confidentiality of this discharge information lies with you and/or your care-partner.  

## 2020-08-08 NOTE — Op Note (Signed)
[facility] Patient Name: Dorothy Armstrong Procedure Date: 08/08/2020 11:12 AM MRN: [id number] Endoscopist: Mallie Mussel L. Loletha Carrow , MD Age: 50 Referring MD:  Date of Birth: [birth year] Gender: Female Account #: [id number] Procedure:                Colonoscopy Indications:              Screening for colorectal malignant neoplasm, This                            is the patient's first colonoscopy Medicines:                Monitored Anesthesia Care Procedure:                Pre-Anesthesia Assessment:                           - Prior to the procedure, a History and Physical                            was performed, and patient medications and                            allergies were reviewed. The patient's tolerance of                            previous anesthesia was also reviewed. The risks                            and benefits of the procedure and the sedation                            options and risks were discussed with the patient.                            All questions were answered, and informed consent                            was obtained. Prior Anticoagulants: The patient has                            taken no previous anticoagulant or antiplatelet                            agents. ASA Grade Assessment: III - A patient with                            severe systemic disease. After reviewing the risks                            and benefits, the patient was deemed in                            satisfactory condition to undergo the procedure.  After obtaining informed consent, the colonoscope                            was passed under direct vision. Throughout the                            procedure, the patient's blood pressure, pulse, and                            oxygen saturations were monitored continuously. The                            Olympus CF-HQ190L (Serial# [id number]) Colonoscope was                            introduced through the  anus and advanced to the the                            cecum, identified by appendiceal orifice and                            ileocecal valve. The colonoscopy was somewhat                            difficult due to a redundant left colon. The                            patient tolerated the procedure well. The quality                            of the bowel preparation was good. The ileocecal                            valve, appendiceal orifice, and rectum were                            photographed. The bowel preparation used was SUPREP. Scope In: 11:28:27 AM Scope Out: 11:50:54 AM Scope Withdrawal Time: 0 hours 18 minutes 37 seconds  Total Procedure Duration: 0 hours 22 minutes 27 seconds  Findings:                 The perianal and digital rectal examinations were                            normal.                           A 8 mm polyp was found in the cecum. The polyp was                            sessile. The polyp was removed with a cold snare.                            Resection and retrieval were complete.  Two sessile polyps were found in the ascending                            colon. The polyps were 1 to 10 mm in size. These                            polyps were removed with a cold snare. Resection                            and retrieval were complete.                           Two sessile polyps were found in the ascending                            colon. The polyps were 8 mm in size. These polyps                            were removed with a cold snare. Resection and                            retrieval were complete.                           The exam was otherwise without abnormality on                            direct and retroflexion views. Complications:            No immediate complications. Estimated Blood Loss:     Estimated blood loss was minimal. Impression:               - One 8 mm polyp in the cecum, removed with a cold                             snare. Resected and retrieved.                           - Two 1 to 10 mm polyps in the ascending colon,                            removed with a cold snare. Resected and retrieved.                           - Two 8 mm polyps in the ascending colon, removed                            with a cold snare. Resected and retrieved.                           - The examination was otherwise normal on direct  and retroflexion views. Recommendation:           - Patient has a contact number available for                            emergencies. The signs and symptoms of potential                            delayed complications were discussed with the                            patient. Return to normal activities tomorrow.                            Written discharge instructions were provided to the                            patient.                           - Resume previous diet.                           - Continue present medications.                           - Await pathology results.                           - Repeat colonoscopy is recommended for                            surveillance. The colonoscopy date will be                            determined after pathology results from today's                            exam become available for review. Meghna Hagmann L. Loletha Carrow, MD 08/08/2020 11:56:11 AM This report has been signed electronically.

## 2020-08-08 NOTE — Progress Notes (Signed)
VS-Donna 

## 2020-08-08 NOTE — Progress Notes (Signed)
Called to room to assist during endoscopic procedure.  Patient ID and intended procedure confirmed with present staff. Received instructions for my participation in the procedure from the performing physician.  

## 2020-08-08 NOTE — Progress Notes (Signed)
Pt. Reports no change in his medical or surgical history since her pre-visit 07/25/2020.

## 2020-08-08 NOTE — Progress Notes (Signed)
To PACU, VSS. Report to Rn.tb 

## 2020-08-09 ENCOUNTER — Other Ambulatory Visit: Payer: Self-pay | Admitting: Obstetrics & Gynecology

## 2020-08-10 ENCOUNTER — Telehealth: Payer: Self-pay

## 2020-08-10 NOTE — Telephone Encounter (Signed)
  Follow up Call-  Call back number 08/08/2020  Post procedure Call Back phone  # [phone]  Permission to leave phone message Yes  Some recent data might be hidden     Patient questions:  Do you have a fever, pain , or abdominal swelling? No. Pain Score  0 *  Have you tolerated food without any problems? Yes.    Have you been able to return to your normal activities? Yes.    Do you have any questions about your discharge instructions: Diet   No. Medications  No. Follow up visit  No.  Do you have questions or concerns about your Care? No.  Actions: * If pain score is 4 or above: No action needed, pain <4. 1. Have you developed a fever since your procedure? no  2.   Have you had an respiratory symptoms (SOB or cough) since your procedure? no  3.   Have you tested positive for COVID 19 since your procedure no  4.   Have you had any family members/close contacts diagnosed with the COVID 19 since your procedure?  no   If yes to any of these questions please route to Joylene John, RN and Joella Prince, RN

## 2020-08-11 ENCOUNTER — Telehealth (HOSPITAL_BASED_OUTPATIENT_CLINIC_OR_DEPARTMENT_OTHER): Payer: Self-pay

## 2020-08-11 NOTE — Telephone Encounter (Signed)
Refill was completed yesterday, 08/10/2020.  Thanks.

## 2020-08-11 NOTE — Telephone Encounter (Signed)
Pt has appointment with you on 09/15/20. Pt would like RF of Paxil to last until her scheduled appointment. Please advise.

## 2020-08-12 ENCOUNTER — Ambulatory Visit: Payer: Managed Care, Other (non HMO)

## 2020-08-18 ENCOUNTER — Encounter: Payer: Self-pay | Admitting: Gastroenterology

## 2020-08-18 ENCOUNTER — Encounter (HOSPITAL_BASED_OUTPATIENT_CLINIC_OR_DEPARTMENT_OTHER): Payer: Self-pay | Admitting: *Deleted

## 2020-09-15 ENCOUNTER — Ambulatory Visit (HOSPITAL_BASED_OUTPATIENT_CLINIC_OR_DEPARTMENT_OTHER): Payer: Managed Care, Other (non HMO) | Admitting: Obstetrics & Gynecology

## 2020-10-13 ENCOUNTER — Encounter (HOSPITAL_BASED_OUTPATIENT_CLINIC_OR_DEPARTMENT_OTHER): Payer: Self-pay | Admitting: Obstetrics & Gynecology

## 2020-11-09 ENCOUNTER — Other Ambulatory Visit: Payer: Self-pay

## 2020-11-09 ENCOUNTER — Ambulatory Visit (INDEPENDENT_AMBULATORY_CARE_PROVIDER_SITE_OTHER): Payer: Managed Care, Other (non HMO) | Admitting: Obstetrics & Gynecology

## 2020-11-09 ENCOUNTER — Encounter (HOSPITAL_BASED_OUTPATIENT_CLINIC_OR_DEPARTMENT_OTHER): Payer: Self-pay | Admitting: Obstetrics & Gynecology

## 2020-11-09 VITALS — BP 142/87 | HR 70 | Ht 62.5 in | Wt 234.8 lb

## 2020-11-09 DIAGNOSIS — Z01419 Encounter for gynecological examination (general) (routine) without abnormal findings: Secondary | ICD-10-CM

## 2020-11-09 DIAGNOSIS — Z1159 Encounter for screening for other viral diseases: Secondary | ICD-10-CM

## 2020-11-09 DIAGNOSIS — Z114 Encounter for screening for human immunodeficiency virus [HIV]: Secondary | ICD-10-CM

## 2020-11-09 DIAGNOSIS — Z8659 Personal history of other mental and behavioral disorders: Secondary | ICD-10-CM

## 2020-11-09 DIAGNOSIS — R7303 Prediabetes: Secondary | ICD-10-CM

## 2020-11-09 DIAGNOSIS — Z975 Presence of (intrauterine) contraceptive device: Secondary | ICD-10-CM

## 2020-11-09 MED ORDER — PAROXETINE HCL 20 MG PO TABS: 20.0000 mg | ORAL_TABLET | Freq: Every day | ORAL | 4 refills | Status: DC

## 2020-11-09 NOTE — Progress Notes (Addendum)
50 y.o. G2P2 Married White or Caucasian female here for annual exam.  Doing well.  Denies vaginal bleeding.  Second Mirena IUD placed 05/29/2019.  No LMP recorded. (Menstrual status: IUD).          Sexually active: Yes.    The current method of family planning is IUD.    Exercising: Yes.     walking Smoker:  no  Health Maintenance: Pap:  05/29/2019 Negative History of abnormal Pap:  no MMG:  10/13/2020 Negative Colonoscopy:  08/08/2020, follow up 3 years Dr. Loletha Carrow BMD:   not indicated TDaP:  05/29/2019 Shingrix:   discussed with pt today Hep C testing: today Screening Labs: today   reports that she has never smoked. She has never used smokeless tobacco. She reports current alcohol use of about 1.0 standard drink of alcohol per week. She reports that she does not use drugs.  Past Medical History:  Diagnosis Date   Allergy    seasonal allergies   Ankle fracture    LEFT   Arthritis    bilateral knees   Hiatal hernia    IBS (irritable bowel syndrome)    Kidney calculi    kidney stone    Past Surgical History:  Procedure Laterality Date   BACK SURGERY  11/2019   CESAREAN SECTION  2001, 2004   x2   CYSTOSCOPY/RETROGRADE/URETEROSCOPY/STONE EXTRACTION WITH BASKET  2001   WISDOM TOOTH EXTRACTION      Current Outpatient Medications  Medication Sig Dispense Refill   ibuprofen (ADVIL) 200 MG tablet daily as needed.     Multiple Vitamins-Minerals (MULTIVITAMIN PO) Take by mouth daily.     PARoxetine (PAXIL) 20 MG tablet Take 1 tablet (20 mg total) by mouth daily. 90 tablet 4   No current facility-administered medications for this visit.    Family History  Problem Relation Age of Onset   Breast cancer Paternal Aunt    Hypertension Father    Hypertension Paternal Grandfather    CVA Paternal Grandfather    Colon cancer Paternal Grandmother 3   Colon polyps Paternal Grandmother 63   Esophageal cancer Neg Hx    Rectal cancer Neg Hx    Stomach cancer Neg Hx     Review  of Systems  All other systems reviewed and are negative.  Exam:   BP (!) 142/87 (BP Location: Right Arm, Patient Position: Sitting, Cuff Size: Large)   Pulse 70   Ht 5' 2.5" (1.588 m)   Wt 234 lb 12.8 oz (106.5 kg)   BMI 42.26 kg/m   Height: 5' 2.5" (158.8 cm)  General appearance: alert, cooperative and appears stated age Head: Normocephalic, without obvious abnormality, atraumatic Neck: no adenopathy, supple, symmetrical, trachea midline and thyroid normal to inspection and palpation Lungs: clear to auscultation bilaterally Breasts: normal appearance, no masses or tenderness Heart: regular rate and rhythm Abdomen: soft, non-tender; bowel sounds normal; no masses,  no organomegaly Extremities: extremities normal, atraumatic, no cyanosis or edema Skin: Skin color, texture, turgor normal. No rashes or lesions Lymph nodes: Cervical, supraclavicular, and axillary nodes normal. No abnormal inguinal nodes palpated Neurologic: Grossly normal   Pelvic: External genitalia:  no lesions              Urethra:  normal appearing urethra with no masses, tenderness or lesions              Bartholins and Skenes: normal                 Vagina: normal  appearing vagina with normal color and no discharge, no lesions              Cervix: no lesions, IUD string noted              Pap taken: No. Bimanual Exam:  Uterus:  normal size, contour, position, consistency, mobility, non-tender              Adnexa: normal adnexa and no mass, fullness, tenderness               Rectovaginal: Confirms               Anus:  normal sphincter tone, no lesions  Chaperone, Octaviano Batty, CMA, was present for exam.  Assessment/Plan: 1. Well woman exam with routine gynecological exam - pap neg with neg HR HPV 2021.  Not indicated today. - MMG 10/2020 - Colonoscopy 08/08/2020, follow up 3 years - BMD not indicated - Vaccines updated and reviewed  2. Prediabetes - Hemoglobin A1c  3. Screening for HIV without presence  of risk factors - HIV Antibody (routine testing w rflx)  4. Encounter for hepatitis C screening test for low risk patient - Hepatitis C antibody  5. IUD (intrauterine device) in place - replaced in 2021  6. History of depression - PARoxetine (PAXIL) 20 MG tablet; Take 1 tablet (20 mg total) by mouth daily.  Dispense: 90 tablet; Refill: 4  7.  Increased risk breast cancer, 25.5%, noted on last MMG at [facility] - yearly breast MRI discussed.  Pt considering.

## 2020-11-09 NOTE — Patient Instructions (Signed)
Minastrin 1/20 is chewable  Ortho Evra patch

## 2020-11-10 LAB — HEMOGLOBIN A1C
Est. average glucose Bld gHb Est-mCnc: 117 mg/dL
Hgb A1c MFr Bld: 5.7 % — ABNORMAL HIGH (ref 4.8–5.6)

## 2020-11-10 LAB — HIV ANTIBODY (ROUTINE TESTING W REFLEX): HIV Screen 4th Generation wRfx: NONREACTIVE

## 2020-11-10 LAB — HEPATITIS C ANTIBODY: Hep C Virus Ab: 0.2 s/co ratio (ref 0.0–0.9)

## 2020-12-21 ENCOUNTER — Telehealth (HOSPITAL_BASED_OUTPATIENT_CLINIC_OR_DEPARTMENT_OTHER): Payer: Self-pay | Admitting: Obstetrics & Gynecology

## 2020-12-21 NOTE — Telephone Encounter (Signed)
Patient called today and want to follow up  about the order for the MRI.Patient is aware this may take few days .

## 2020-12-22 ENCOUNTER — Other Ambulatory Visit (HOSPITAL_BASED_OUTPATIENT_CLINIC_OR_DEPARTMENT_OTHER): Payer: Self-pay | Admitting: Obstetrics & Gynecology

## 2020-12-22 DIAGNOSIS — Z9189 Other specified personal risk factors, not elsewhere classified: Secondary | ICD-10-CM

## 2020-12-22 DIAGNOSIS — Z803 Family history of malignant neoplasm of breast: Secondary | ICD-10-CM

## 2020-12-23 NOTE — Telephone Encounter (Signed)
DPR reviewed. LMOVM that breast MRI order had been placed and she should expect a call from [facility] for scheduling.

## 2021-01-13 ENCOUNTER — Ambulatory Visit
Admission: RE | Admit: 2021-01-13 | Discharge: 2021-01-13 | Disposition: A | Discharge status: Home / Self Care | Payer: Managed Care, Other (non HMO) | Source: Ambulatory Visit | Attending: Obstetrics & Gynecology | Admitting: Obstetrics & Gynecology

## 2021-01-13 ENCOUNTER — Other Ambulatory Visit (HOSPITAL_BASED_OUTPATIENT_CLINIC_OR_DEPARTMENT_OTHER): Payer: Self-pay | Admitting: Obstetrics & Gynecology

## 2021-01-13 ENCOUNTER — Other Ambulatory Visit: Payer: Self-pay

## 2021-01-13 DIAGNOSIS — Z803 Family history of malignant neoplasm of breast: Secondary | ICD-10-CM

## 2021-01-13 DIAGNOSIS — Z9189 Other specified personal risk factors, not elsewhere classified: Secondary | ICD-10-CM

## 2021-01-13 IMAGING — MR MR BREAST BILAT WO/W CM
8 of 12 series · 33 of 48 positions shown · IV contrast (9ml gadavist)
Comparison: Previous exams.

CLINICAL DATA: 50-year-old female with strong family history of
breast cancer presents for high risk screening breast MRI. Patient
was seen [DATE] for mammography with calcifications noted in the
mid central right breast found to be stable and benign. Note breast
MRI initially performed [DATE] however due to excessive motion
artifact the patient returned on [DATE] for additional imaging.

LABS:  Not applicable.
EXAM:
BILATERAL BREAST MRI WITH AND WITHOUT CONTRAST
TECHNIQUE: Multiplanar, multisequence MR images of both breasts were obtained
prior to and following the intravenous administration of 10 ml of
Gadavist

[Series 2: t2_tirm_tra ipat (a-p) · axial · 3.0mm · 0.70mm/px · 1 of 60 slices shown]
[im 1/60]
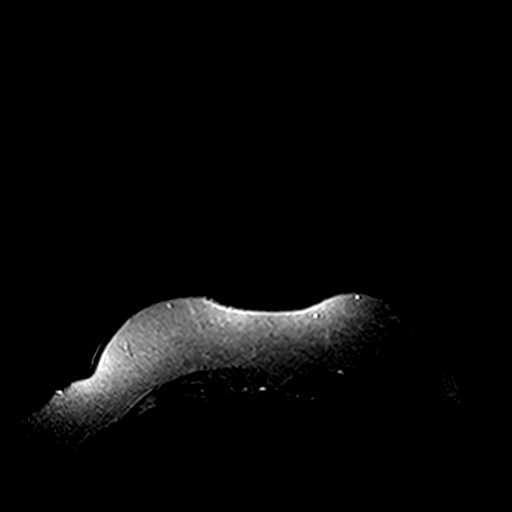

[Series 3: fl3d pre-cm non · axial · non-contrast · 1.2mm · 0.94mm/px · z∈[-36,+136]mm · 5 of 144 slices shown]
[im 1/144]
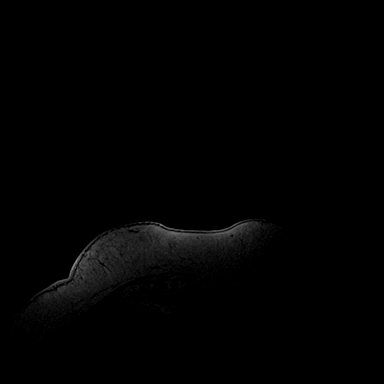
[im 36/144]
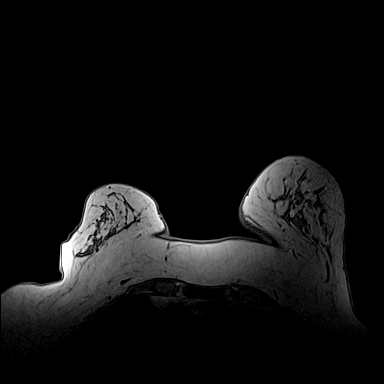
[im 72/144]
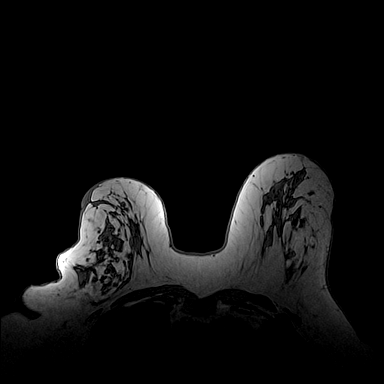
[im 108/144]
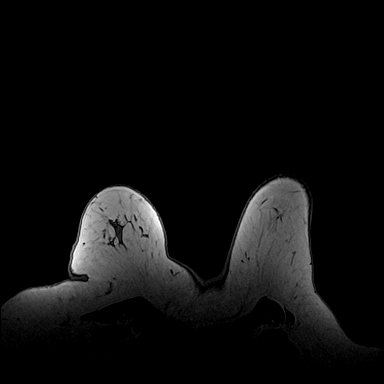
[im 144/144]
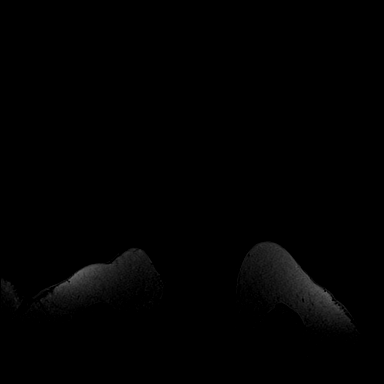

[Series 4: fl3d pre-cm · axial · non-contrast · 1.2mm · 0.94mm/px · z∈[-36,+136]mm · 5 of 144 slices shown]
[im 1/144]
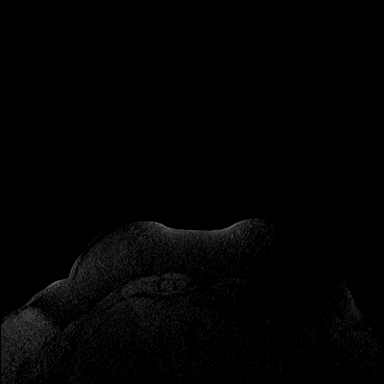
[im 36/144]
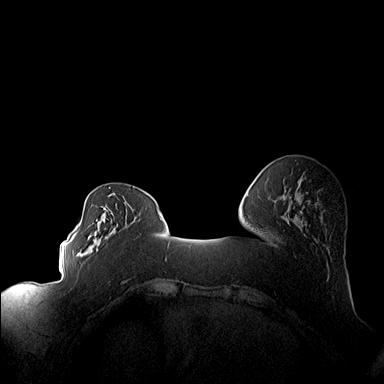
[im 72/144]
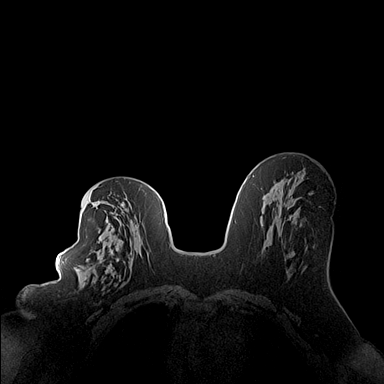
[im 108/144]
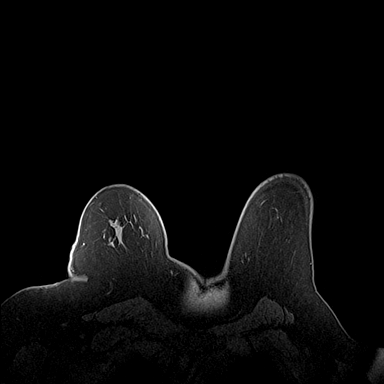
[im 144/144]
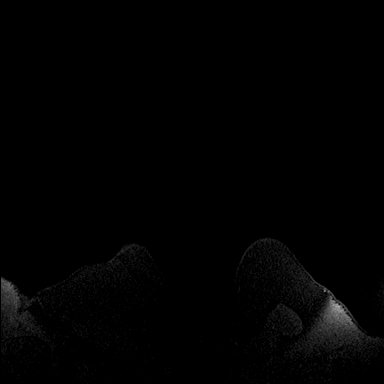

[Series 5: fl3d pre-cm 20 · axial · non-contrast · 1.2mm · 0.94mm/px · z∈[-36,+136]mm · 5 of 144 slices shown (1 of 3)]
[im 1/144]
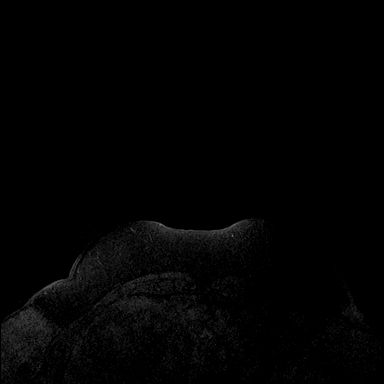
[im 36/144]
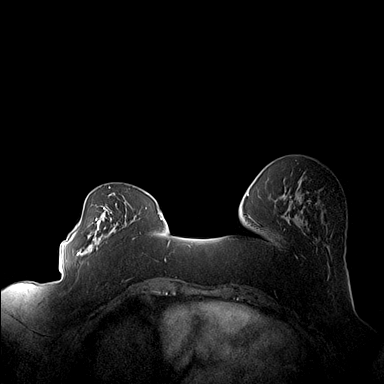
[im 72/144]
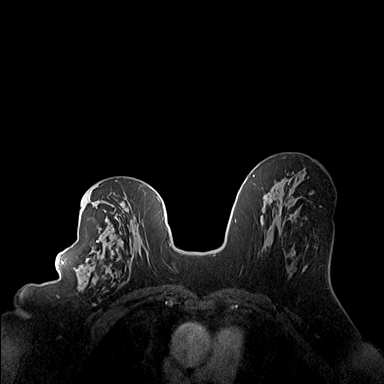
[im 108/144]
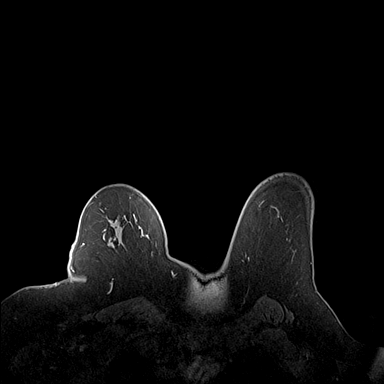
[im 144/144]
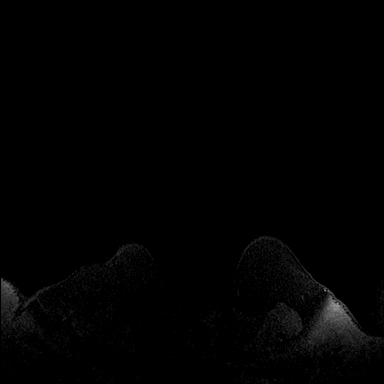

[Series 6: fl3d pre-cm 20 · axial · non-contrast · 1.2mm · 0.94mm/px · z∈[-36,+136]mm · 5 of 144 slices shown (2 of 3)]
[im 1/144]
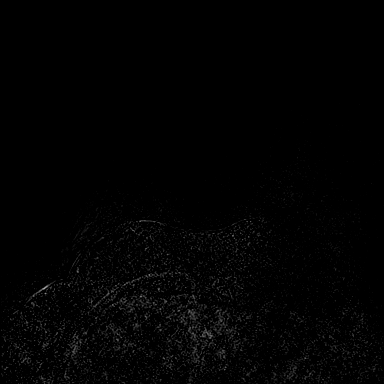
[im 36/144]
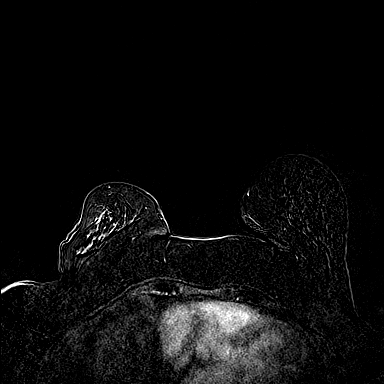
[im 72/144]
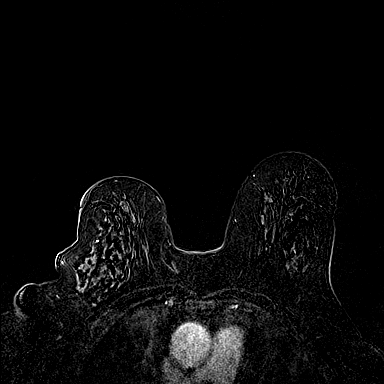
[im 108/144]
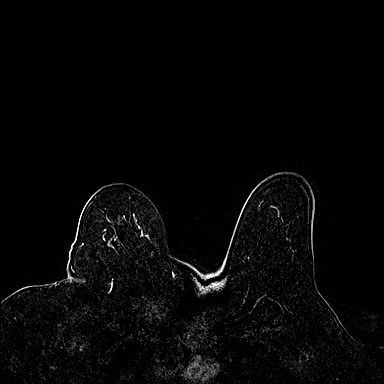
[im 144/144]
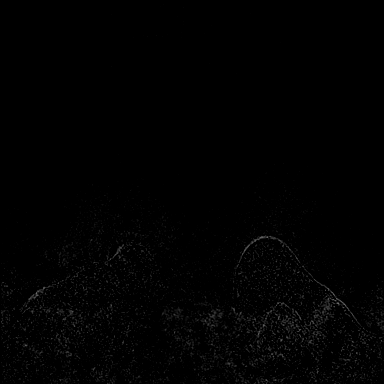

[Series 7: fl3d pre-cm 20 · axial · non-contrast · 172.8mm · 0.94mm/px · 1 of 1 slices shown (3 of 3)]
[im 1/1]
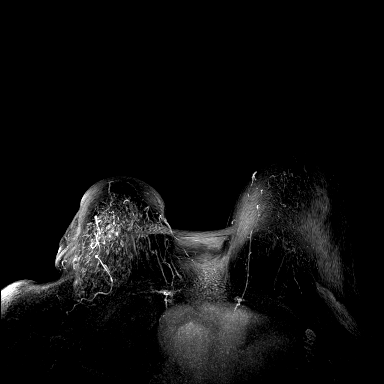

[Series 8: fl3d pre-cm 3min · axial · non-contrast · 1.2mm · 0.94mm/px · z∈[-36,+136]mm · 6 of 144 slices shown]
[im 1/144]
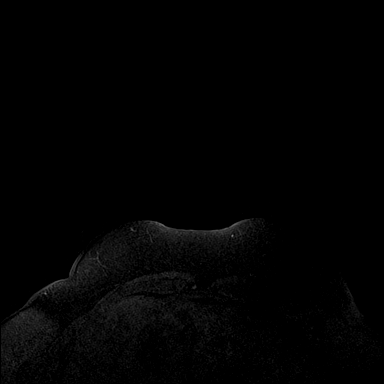
[im 29/144]
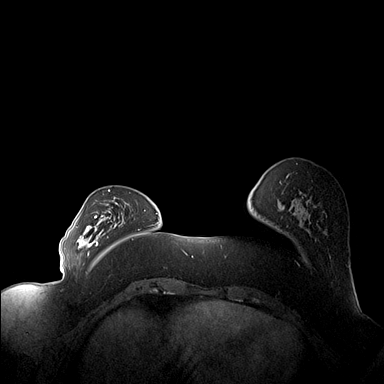
[im 58/144]
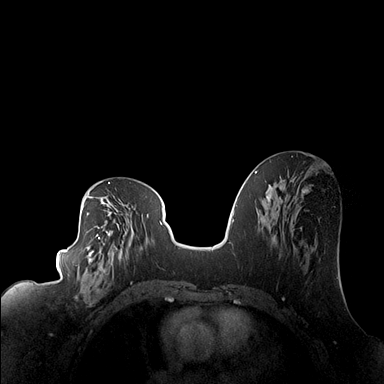
[im 86/144]
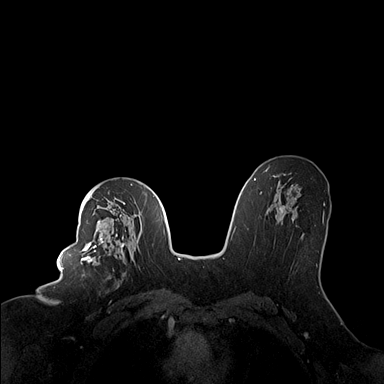
[im 115/144]
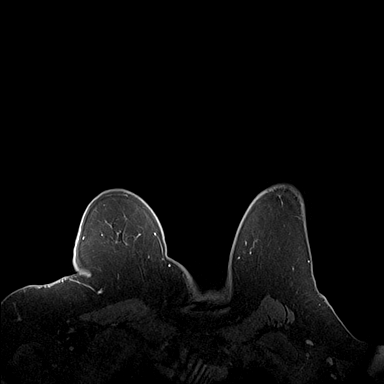
[im 144/144]
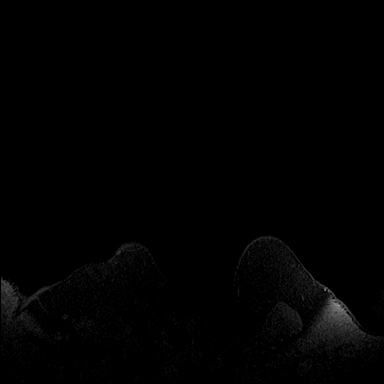

[Series 9: fl3d pre-cm 3min_sub · axial · non-contrast · 1.2mm · 0.94mm/px · z∈[-36,+101]mm · 5 of 144 slices shown]
[im 1/144]
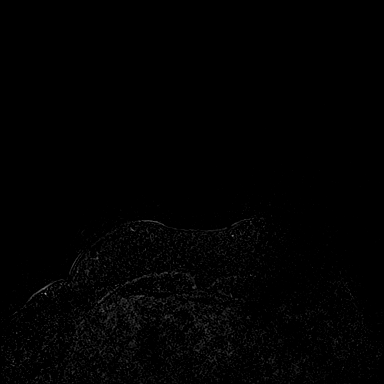
[im 29/144]
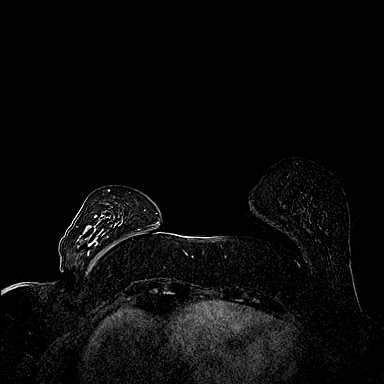
[im 58/144]
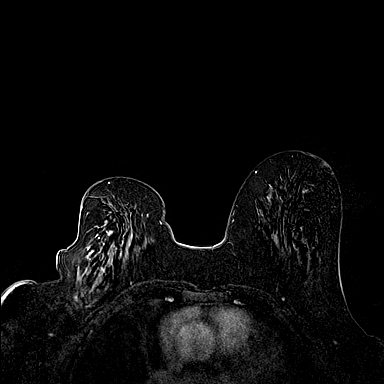
[im 86/144]
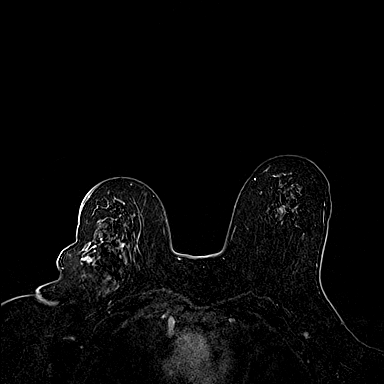
[im 115/144]
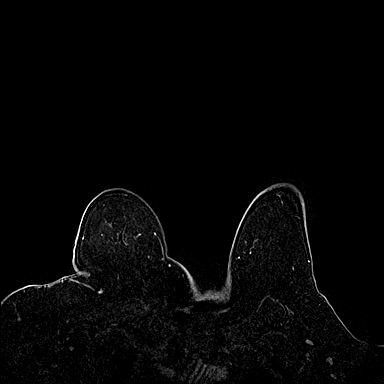

[33 of 48 positions shown; findings below may reference images not displayed]

Three-dimensional MR images were rendered by post-processing of the
original MR data on an independent workstation. The
three-dimensional MR images were interpreted, and findings are
reported in the following complete MRI report for this study. Three
dimensional images were evaluated at the independent interpreting
workstation using the DynaCAD thin client.
FINDINGS: Breast composition: c.  Heterogeneous fibroglandular tissue.

Background parenchymal enhancement: Mild to moderate.

Right breast: No mass or abnormal enhancement.

Left breast: No mass or abnormal enhancement.

Lymph nodes: No abnormal appearing lymph nodes.

Ancillary findings:  None.
IMPRESSION: No MRI findings of malignancy in either breast.

RECOMMENDATION:
1. Recommend annual routine screening mammography, due [DATE].

2. Strong family history, with lifetime risk of developing breast
cancer calculated at greater than 20%. Based on the recommendations
of the [employer], annual screening MRI is suggested in
addition to annual mammography if the patient has an estimated
lifetime risk of developing breast cancer which is greater than 20%.

BI-RADS CATEGORY  1: Negative.

## 2021-01-13 MED ORDER — GADOBUTROL 1 MMOL/ML IV SOLN
10.0000 mL | Freq: Once | INTRAVENOUS | Status: AC | PRN
  Administered 2021-01-13: 10 mL via INTRAVENOUS

## 2021-01-18 ENCOUNTER — Other Ambulatory Visit: Payer: Self-pay

## 2021-01-18 ENCOUNTER — Ambulatory Visit
Admission: RE | Admit: 2021-01-18 | Discharge: 2021-01-18 | Disposition: A | Discharge status: Home / Self Care | Payer: Managed Care, Other (non HMO) | Source: Ambulatory Visit | Attending: Obstetrics & Gynecology | Admitting: Obstetrics & Gynecology

## 2021-01-18 DIAGNOSIS — Z803 Family history of malignant neoplasm of breast: Secondary | ICD-10-CM

## 2021-01-18 DIAGNOSIS — Z9189 Other specified personal risk factors, not elsewhere classified: Secondary | ICD-10-CM

## 2021-01-18 IMAGING — MR MR BREAST BILAT WO/W CM
7 of 10 series · 33 of 48 positions shown · IV contrast (10 ml gadavist)
Comparison: Previous exams.

CLINICAL DATA: 50-year-old female with strong family history of
breast cancer presents for high risk screening breast MRI. Patient
was seen [DATE] for mammography with calcifications noted in the
mid central right breast found to be stable and benign. Note breast
MRI initially performed [DATE] however due to excessive motion
artifact the patient returned on [DATE] for additional imaging.

LABS:  Not applicable.
EXAM:
BILATERAL BREAST MRI WITH AND WITHOUT CONTRAST
TECHNIQUE: Multiplanar, multisequence MR images of both breasts were obtained
prior to and following the intravenous administration of 10 ml of
Gadavist

[Series 2: fl3d pre-cm · axial · non-contrast · 1.2mm · 0.89mm/px · z∈[-110,+61]mm · 6 of 144 slices shown]
[im 1/144]
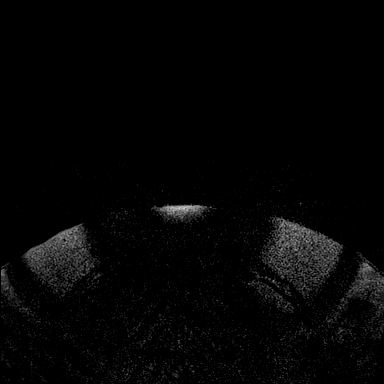
[im 29/144]
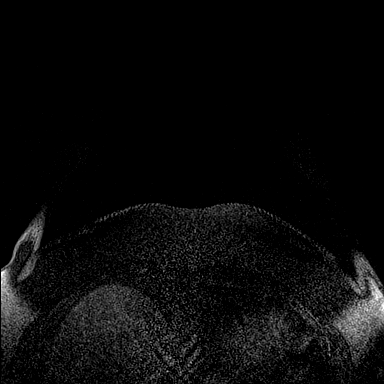
[im 58/144]
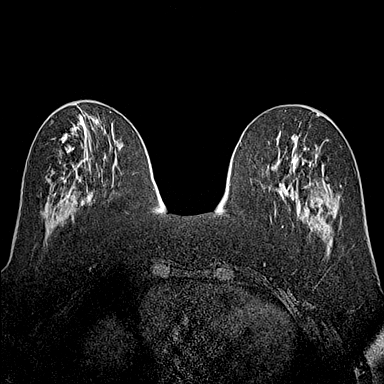
[im 86/144]
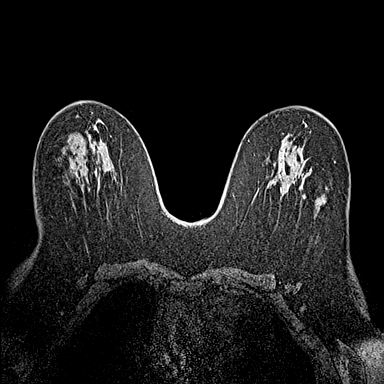
[im 115/144]
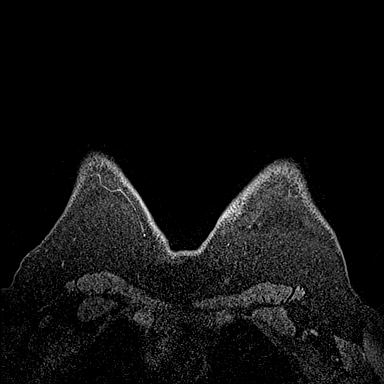
[im 144/144]
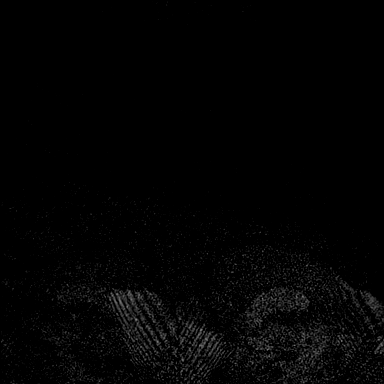

[Series 3: fl3d post-cm 20 · axial · 1.2mm · 0.89mm/px · z∈[-110,+61]mm · 6 of 144 slices shown (1 of 3)]
[im 1/144]
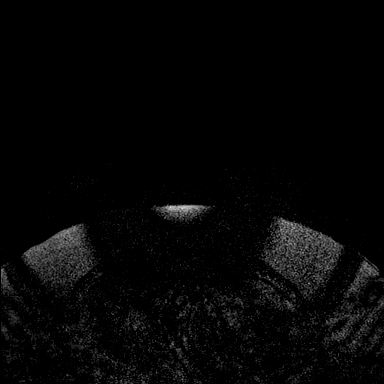
[im 29/144]
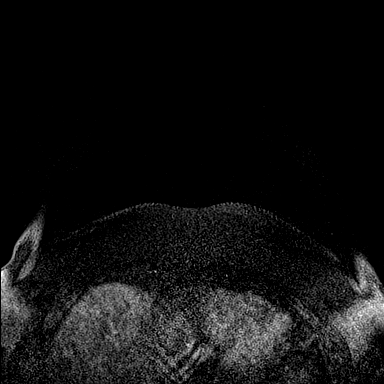
[im 58/144]
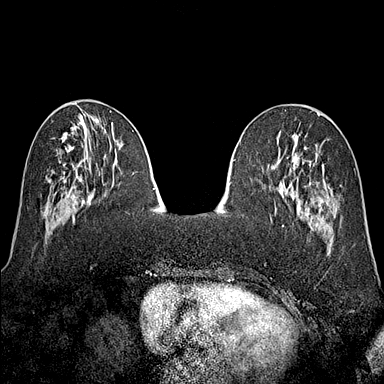
[im 86/144]
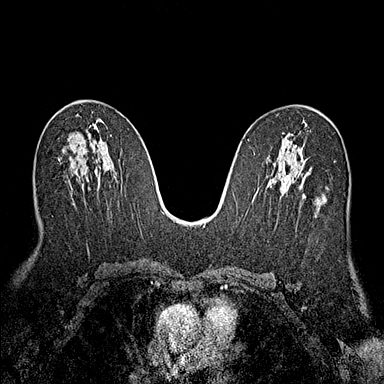
[im 115/144]
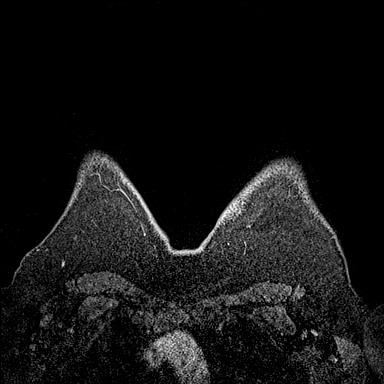
[im 144/144]
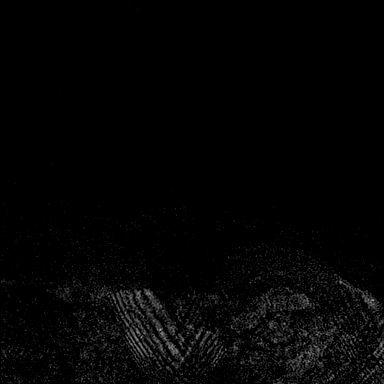

[Series 4: fl3d post-cm 20 · axial · 1.2mm · 0.89mm/px · z∈[-110,+61]mm · 6 of 144 slices shown (2 of 3)]
[im 1/144]
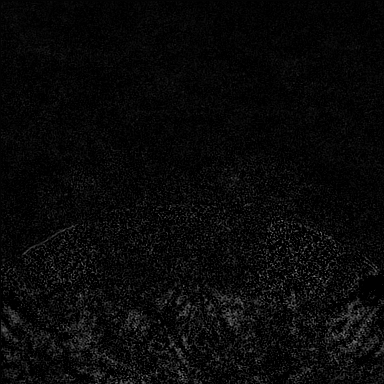
[im 29/144]
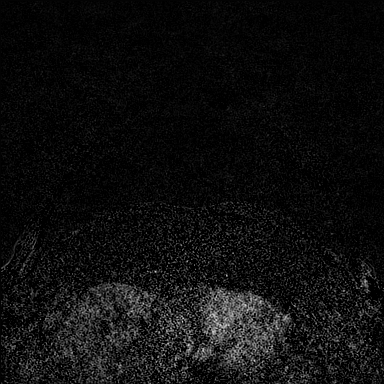
[im 58/144]
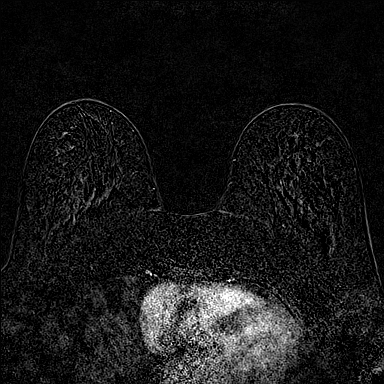
[im 86/144]
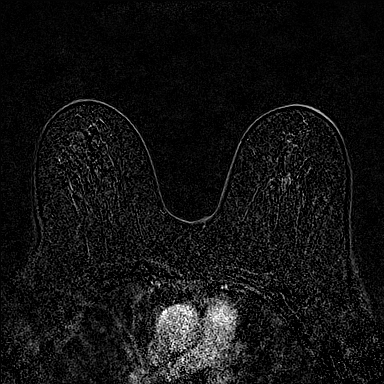
[im 115/144]
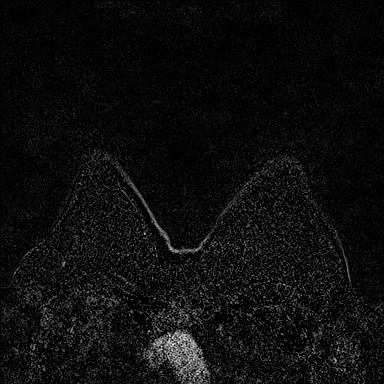
[im 144/144]
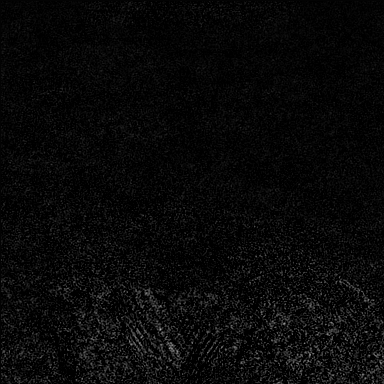

[Series 5: fl3d post-cm 20 · axial · 172.8mm · 0.89mm/px · 1 of 1 slices shown (3 of 3)]
[im 1/1]
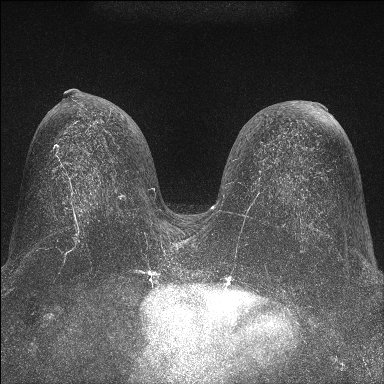

[Series 6: fl3d post-cm 3 · axial · 1.2mm · 0.89mm/px · z∈[-110,+61]mm · 6 of 144 slices shown (1 of 3)]
[im 1/144]
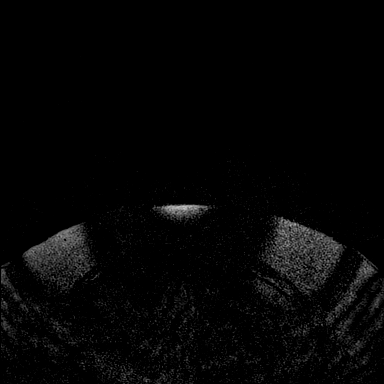
[im 29/144]
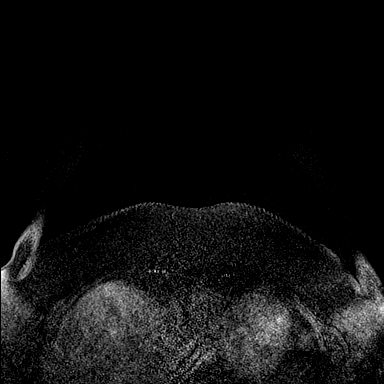
[im 58/144]
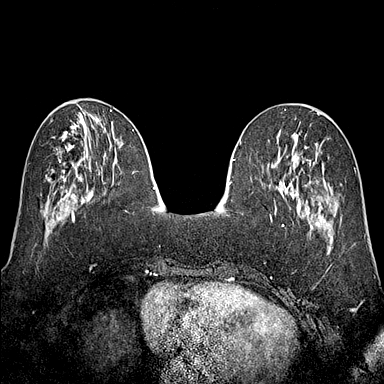
[im 86/144]
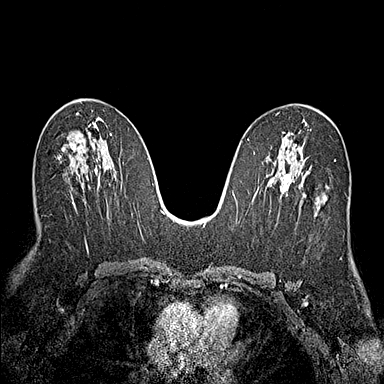
[im 115/144]
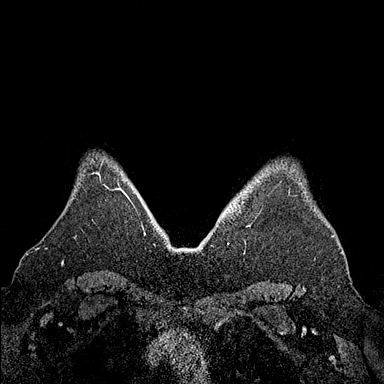
[im 144/144]
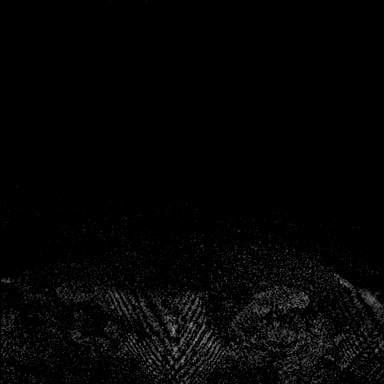

[Series 7: fl3d post-cm 3 · axial · 1.2mm · 0.89mm/px · z∈[-110,+61]mm · 7 of 144 slices shown (2 of 3)]
[im 1/144]
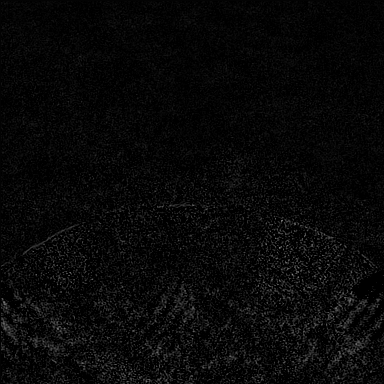
[im 24/144]
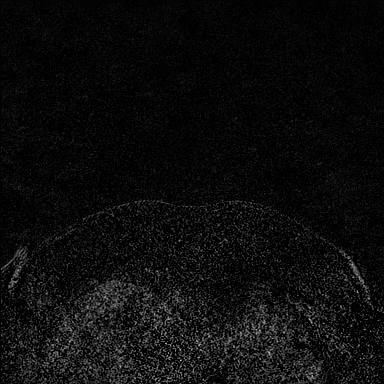
[im 48/144]
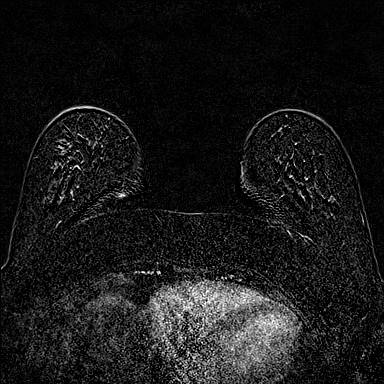
[im 72/144]
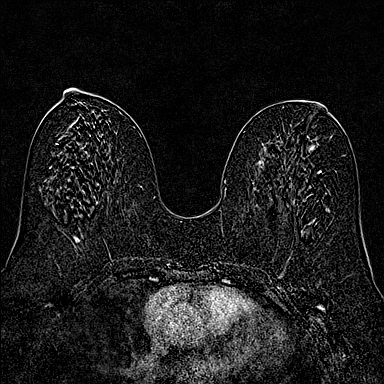
[im 96/144]
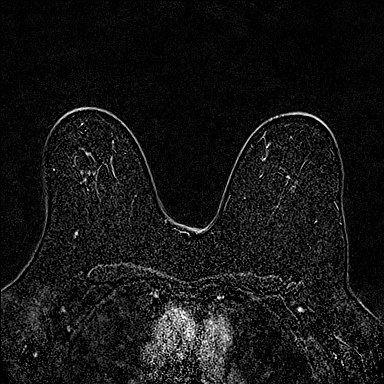
[im 120/144]
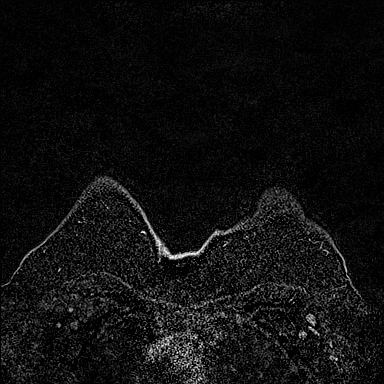
[im 144/144]
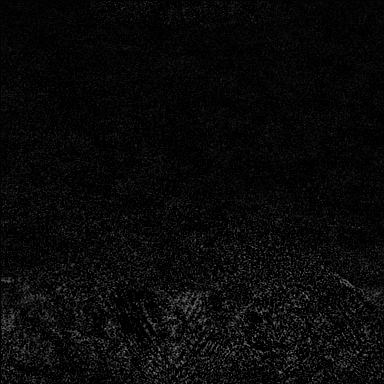

[Series 8: fl3d post-cm 3 · axial · 172.8mm · 0.89mm/px · 1 of 1 slices shown (3 of 3)]
[im 1/1]
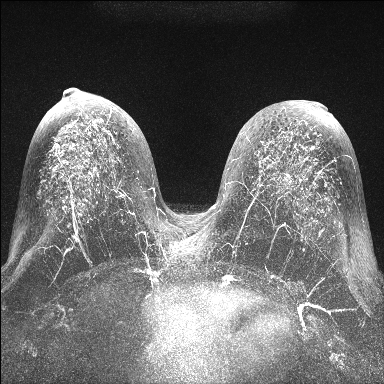

[33 of 48 positions shown; findings below may reference images not displayed]

Three-dimensional MR images were rendered by post-processing of the
original MR data on an independent workstation. The
three-dimensional MR images were interpreted, and findings are
reported in the following complete MRI report for this study. Three
dimensional images were evaluated at the independent interpreting
workstation using the DynaCAD thin client.
FINDINGS: Breast composition: c.  Heterogeneous fibroglandular tissue.

Background parenchymal enhancement: Mild to moderate.

Right breast: No mass or abnormal enhancement.

Left breast: No mass or abnormal enhancement.

Lymph nodes: No abnormal appearing lymph nodes.

Ancillary findings:  None.
IMPRESSION: No MRI findings of malignancy in either breast.

RECOMMENDATION:
1. Recommend annual routine screening mammography, due [DATE].

2. Strong family history, with lifetime risk of developing breast
cancer calculated at greater than 20%. Based on the recommendations
of the [employer] Society, annual screening MRI is suggested in
addition to annual mammography if the patient has an estimated
lifetime risk of developing breast cancer which is greater than 20%.

BI-RADS CATEGORY  1: Negative.

## 2021-04-21 ENCOUNTER — Other Ambulatory Visit: Payer: Self-pay

## 2021-04-21 ENCOUNTER — Telehealth (INDEPENDENT_AMBULATORY_CARE_PROVIDER_SITE_OTHER): Payer: Managed Care, Other (non HMO) | Admitting: Family Medicine

## 2021-04-21 ENCOUNTER — Encounter: Payer: Self-pay | Admitting: Family Medicine

## 2021-04-21 VITALS — Ht 62.5 in

## 2021-04-21 DIAGNOSIS — J0100 Acute maxillary sinusitis, unspecified: Secondary | ICD-10-CM | POA: Insufficient documentation

## 2021-04-21 MED ORDER — AMOXICILLIN 500 MG PO CAPS: 1000.0000 mg | ORAL_CAPSULE | Freq: Two times a day (BID) | ORAL | 0 refills | Status: DC

## 2021-04-21 MED ORDER — GUAIFENESIN-CODEINE 100-10 MG/5ML PO SYRP: 5.0000 mL | ORAL_SOLUTION | Freq: Every evening | ORAL | 0 refills | Status: DC | PRN

## 2021-04-21 NOTE — Assessment & Plan Note (Signed)
>   7 days of illness.. concerning for bacterial sinus infection. Treat with amox x 10 days, nasal saline and mucolytic.

## 2021-04-21 NOTE — Progress Notes (Signed)
VIRTUAL VISIT Due to national recommendations of social distancing due to [place] 19, a virtual visit is felt to be most appropriate for this patient at this time.   I connected with the patient on 04/21/21 at 12:00 PM EST by virtual telehealth platform and verified that I am speaking with the correct person using two identifiers.   I discussed the limitations, risks, security and privacy concerns of performing an evaluation and management service by  virtual telehealth platform and the availability of in person appointments. I also discussed with the patient that there may be a patient responsible charge related to this service. The patient expressed understanding and agreed to proceed.  Patient location: Home Provider Location: [place] Participants: Eliezer Lofts and [name]   Chief Complaint  Patient presents with   Cough    2 negative Covid home test   Facial Pain    Sinuses feel tender    History of Present Illness:   50 year old female presents with new onset cough facial pain and sinus pressure.   Date of Onset: 04/04/21  She started with nasal congestion, progressed to dry cough.  Now with new sinus pressure and pain x 1-2 weeks.  Greenish nasal discharge.    No ear pain.  Post nasal drip, occ ST when nose draining.   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  She has used ibuprofen, Mucinex, dayquil, nyquil, virt C and elderberry.   COVID 19 screen COVID testing: home test x 2 negative. COVID vaccine: 03/07/2020 , 08/03/2019 , 07/13/2019, flu shot as well COVID exposure: No recent travel or known exposure to [place]  The importance of social distancing was discussed today.    Review of Systems  All other systems reviewed and are negative.    Past Medical History:  Diagnosis Date   Allergy    seasonal allergies   Ankle fracture    LEFT   Arthritis    bilateral knees   Hiatal hernia    IBS (irritable bowel syndrome)     Kidney calculi    kidney stone    reports that she has never smoked. She has never used smokeless tobacco. She reports current alcohol use of about 1.0 standard drink per week. She reports that she does not use drugs.   Current Outpatient Medications:    ibuprofen (ADVIL) 200 MG tablet, daily as needed., Disp: , Rfl:    Multiple Vitamins-Minerals (MULTIVITAMIN PO), Take by mouth daily., Disp: , Rfl:    PARoxetine (PAXIL) 20 MG tablet, Take 1 tablet (20 mg total) by mouth daily., Disp: 90 tablet, Rfl: 4   Observations/Objective: Height 5' 2.5" (1.588 m).  Physical Exam  Physical Exam Constitutional:      General: The patient is not in acute distress. Pulmonary:     Effort: Pulmonary effort is normal. No respiratory distress.  Neurological:     Mental Status: The patient is alert and oriented to person, place, and time.  Psychiatric:        Mood and Affect: Mood normal.        Behavior: Behavior normal.   Assessment and Plan Problem List Items Addressed This Visit     Acute non-recurrent maxillary sinusitis - Primary    > 7 days of illness.. concerning for bacterial sinus infection. Treat with amox x 10 days, nasal saline and mucolytic.       Relevant Medications   amoxicillin (AMOXIL) 500 MG  capsule   guaiFENesin-codeine (ROBITUSSIN AC) 100-10 MG/5ML syrup      I discussed the assessment and treatment plan with the patient. The patient was provided an opportunity to ask questions and all were answered. The patient agreed with the plan and demonstrated an understanding of the instructions.   The patient was advised to call back or seek an in-person evaluation if the symptoms worsen or if the condition fails to improve as anticipated.     Eliezer Lofts, MD

## 2021-05-05 ENCOUNTER — Telehealth: Payer: Managed Care, Other (non HMO) | Admitting: Physician Assistant

## 2021-05-05 DIAGNOSIS — R058 Other specified cough: Secondary | ICD-10-CM

## 2021-05-05 MED ORDER — BENZONATATE 100 MG PO CAPS
100.0000 mg | ORAL_CAPSULE | Freq: Three times a day (TID) | ORAL | 0 refills | Status: DC | PRN
Start: 2021-05-05 — End: 2022-01-24

## 2021-05-05 MED ORDER — PREDNISONE 10 MG (21) PO TBPK: ORAL_TABLET | ORAL | 0 refills | Status: DC

## 2021-05-05 NOTE — Progress Notes (Signed)
We are sorry that you are not feeling well.  Here is how we plan to help!  Based on your presentation I believe you most likely have A cough due to a virus.  This is called viral bronchitis and is best treated by rest, plenty of fluids and control of the cough.  You may use Ibuprofen or Tylenol as directed to help your symptoms.   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 This is a chronic cough after a moderate upper respiratory infection that is caused from residual inflammation in the airway from the previous infection.   In addition you may use A prescription cough medication called Tessalon Perles 100mg . You may take 1-2 capsules every 8 hours as needed for your cough.  Prednisone 10 mg daily for 6 days (see taper instructions below)  Directions for 6 day taper: Day 1: 2 tablets before breakfast, 1 after both lunch & dinner and 2 at bedtime Day 2: 1 tab before breakfast, 1 after both lunch & dinner and 2 at bedtime Day 3: 1 tab at each meal & 1 at bedtime Day 4: 1 tab at breakfast, 1 at lunch, 1 at bedtime Day 5: 1 tab at breakfast & 1 tab at bedtime Day 6: 1 tab at breakfast  From your responses in the eVisit questionnaire you describe inflammation in the upper respiratory tract which is causing a significant cough.  This is commonly called Bronchitis and has four common causes:   Allergies Viral Infections Acid Reflux Bacterial Infection Allergies, viruses and acid reflux are treated by controlling symptoms or eliminating the cause. An example might be a cough caused by taking certain blood pressure medications. You stop the cough by changing the medication. Another example might be a cough caused by acid reflux. Controlling the reflux helps control the cough.  USE OF BRONCHODILATOR ("RESCUE") INHALERS: There is a risk from using your bronchodilator too frequently.  The risk is that over-reliance on a medication which only relaxes the  muscles surrounding the breathing tubes can reduce the effectiveness of medications prescribed to reduce swelling and congestion of the tubes themselves.  Although you feel brief relief from the bronchodilator inhaler, your asthma may actually be worsening with the tubes becoming more swollen and filled with mucus.  This can delay other crucial treatments, such as oral steroid medications. If you need to use a bronchodilator inhaler daily, several times per day, you should discuss this with your provider.  There are probably better treatments that could be used to keep your asthma under control.     HOME CARE Only take medications as instructed by your medical team. Complete the entire course of an antibiotic. Drink plenty of fluids and get plenty of rest. Avoid close contacts especially the very young and the elderly Cover your mouth if you cough or cough into your sleeve. Always remember to wash your hands A steam or ultrasonic humidifier can help congestion.   GET HELP RIGHT AWAY IF: You develop worsening fever. You become short of breath You cough up blood. Your symptoms persist after you have completed your treatment plan MAKE SURE YOU  Understand these instructions. Will watch your condition. Will get help right away if you are not doing well or get worse.    Thank you for choosing an e-visit.  Your e-visit answers were reviewed by a board certified advanced clinical practitioner to complete your personal care plan. Depending upon the condition, your plan could have included  both over the counter or prescription medications.  Please review your pharmacy choice. Make sure the pharmacy is open so you can pick up prescription now. If there is a problem, you may contact your provider through [employer] and have the prescription routed to another pharmacy.  Your safety is important to [place]. If you have drug allergies check your prescription carefully.   For the next 24 hours you can  use MyChart to ask questions about today's visit, request a non-urgent call back, or ask for a work or school excuse. You will get an email in the next two days asking about your experience. I hope that your e-visit has been valuable and will speed your recovery.  I provided 5 minutes of non face-to-face time during this encounter for chart review and documentation.

## 2021-05-24 ENCOUNTER — Ambulatory Visit: Payer: Managed Care, Other (non HMO) | Admitting: Internal Medicine

## 2021-05-30 ENCOUNTER — Ambulatory Visit: Payer: Managed Care, Other (non HMO) | Admitting: Internal Medicine

## 2021-05-30 ENCOUNTER — Ambulatory Visit (INDEPENDENT_AMBULATORY_CARE_PROVIDER_SITE_OTHER): Payer: Managed Care, Other (non HMO)

## 2021-05-30 ENCOUNTER — Other Ambulatory Visit: Payer: Self-pay

## 2021-05-30 ENCOUNTER — Encounter: Payer: Self-pay | Admitting: Internal Medicine

## 2021-05-30 DIAGNOSIS — R059 Cough, unspecified: Secondary | ICD-10-CM

## 2021-05-30 DIAGNOSIS — H6983 Other specified disorders of Eustachian tube, bilateral: Secondary | ICD-10-CM | POA: Diagnosis not present

## 2021-05-30 DIAGNOSIS — J069 Acute upper respiratory infection, unspecified: Secondary | ICD-10-CM

## 2021-05-30 IMAGING — DX DG CHEST 2V
2 series · 2 of 2 positions shown · non-contrast
Comparison: [DATE]

CLINICAL DATA: Persistent cough.

EXAM:
CHEST - 2 VIEW

[chest pa]
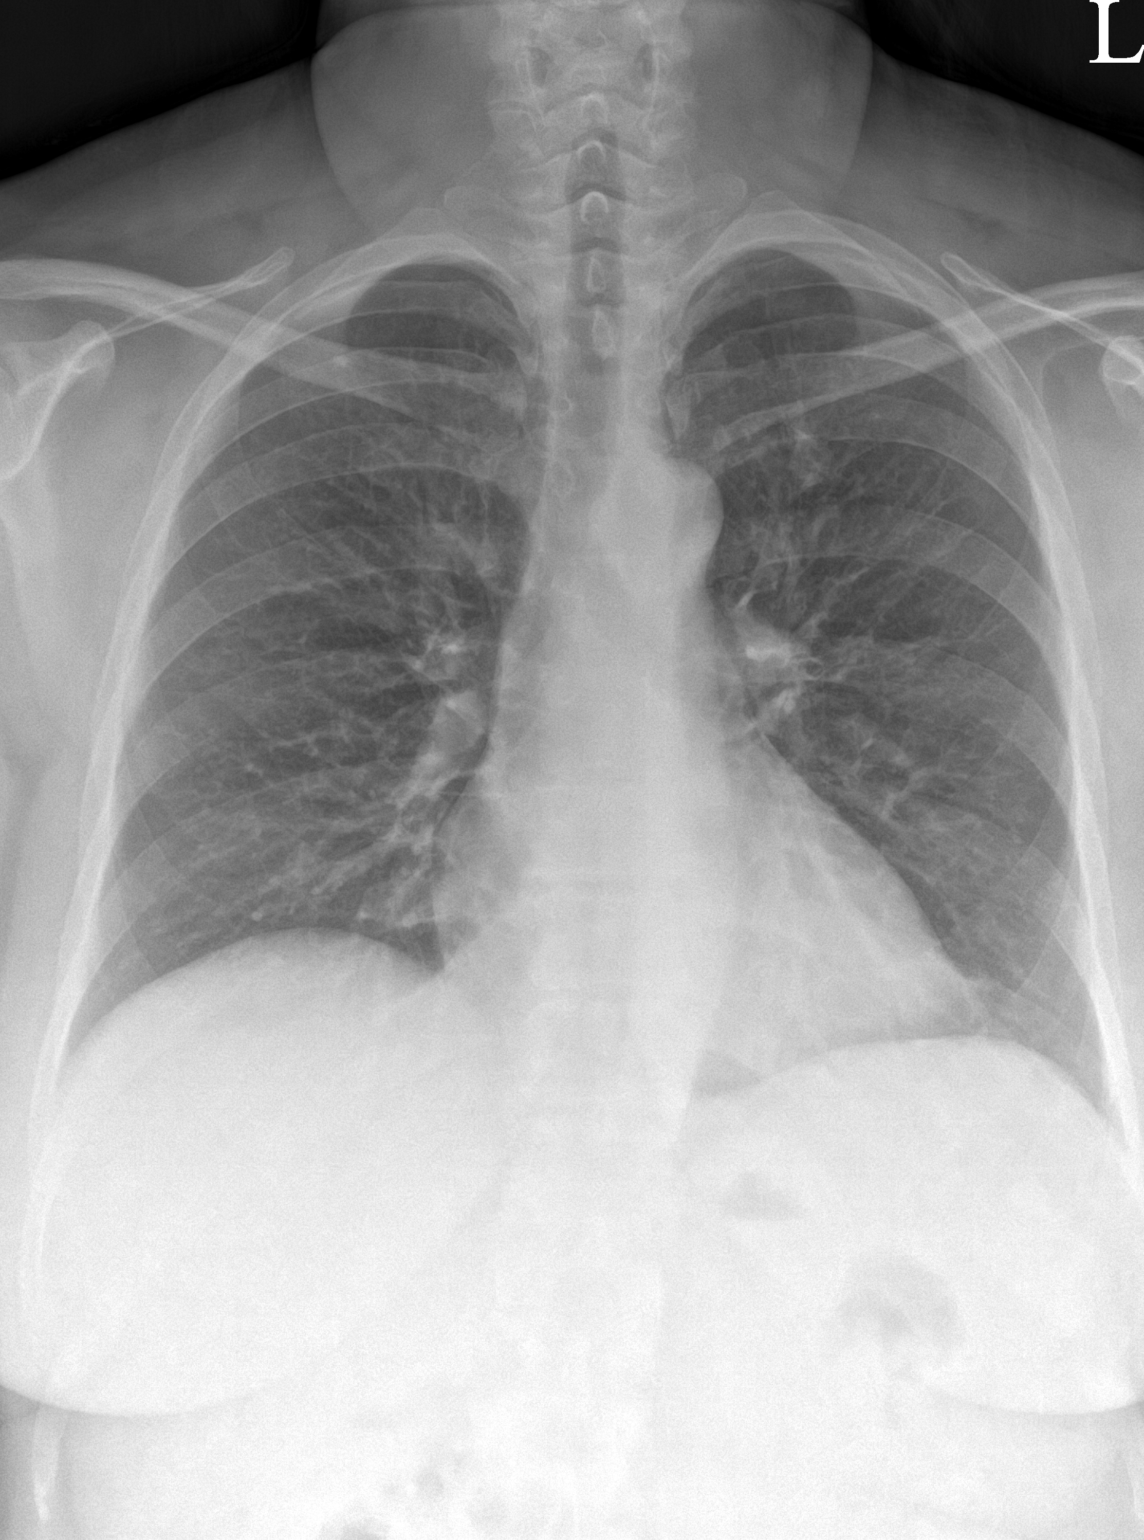

[chest lat]
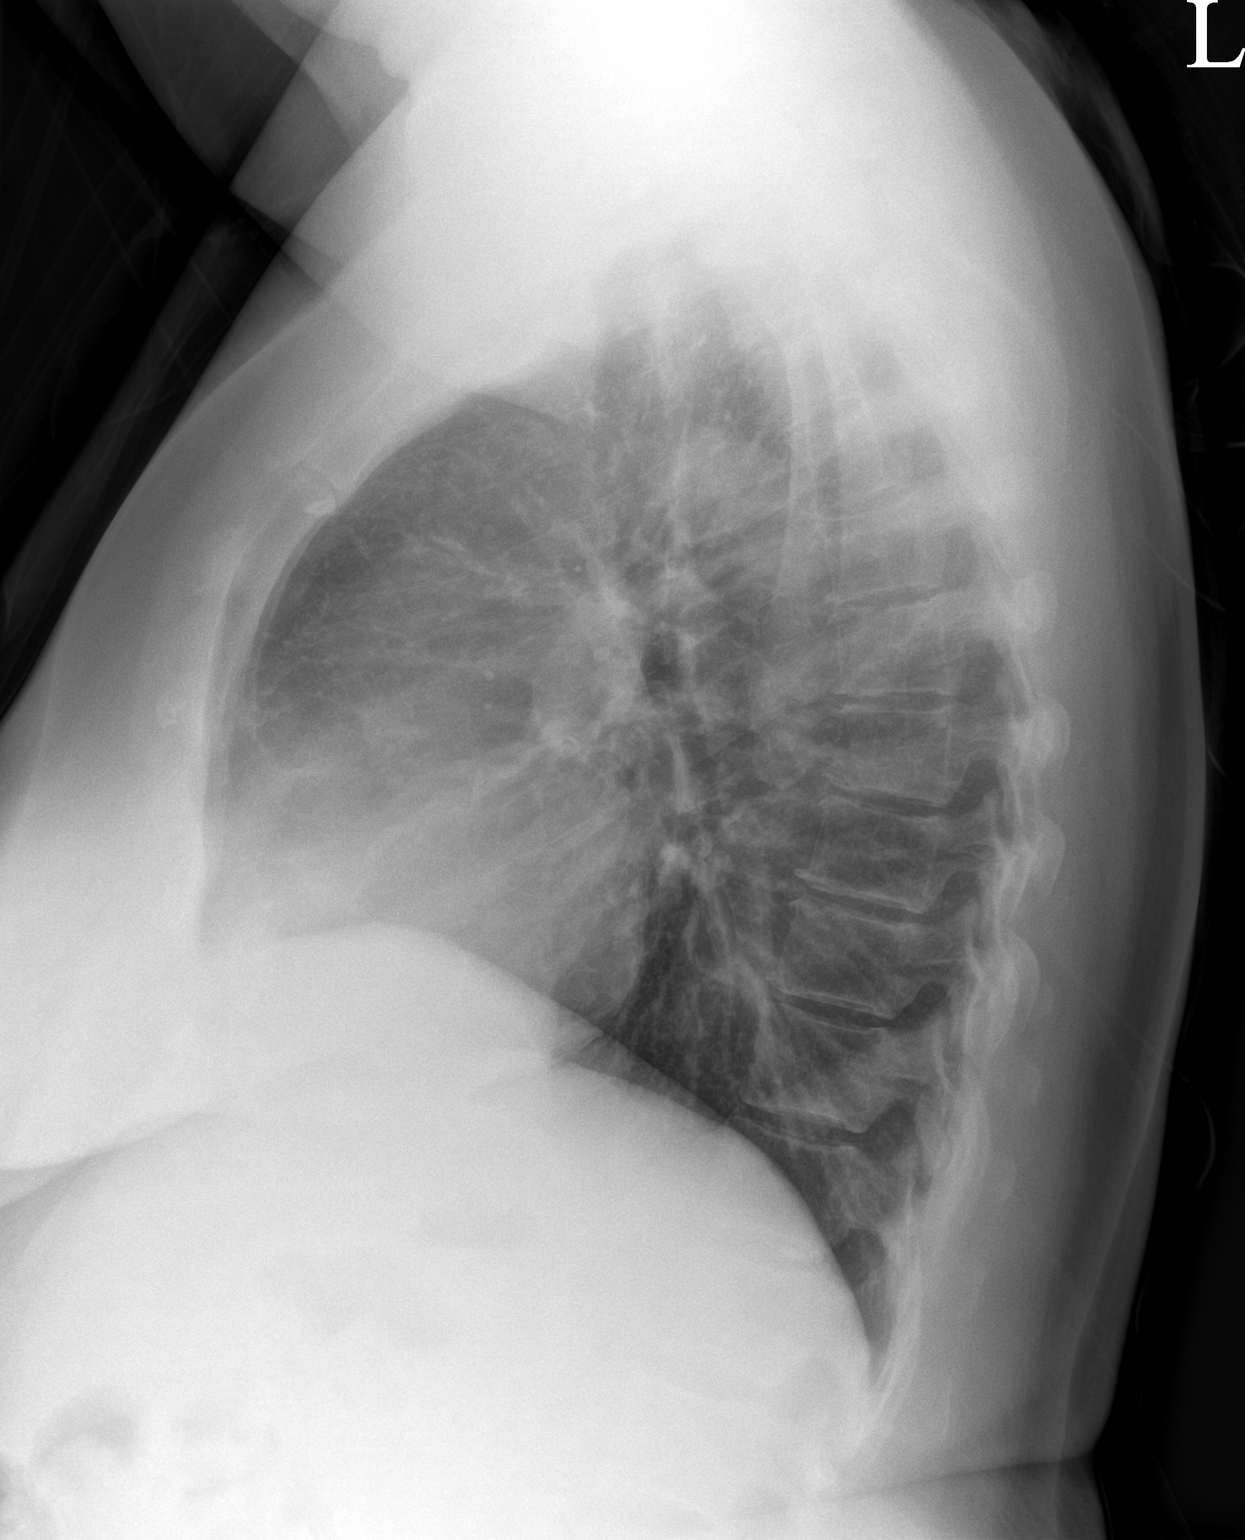

[2 of 2 positions shown; findings below may reference images not displayed]

FINDINGS: The lungs are clear without focal pneumonia, edema, pneumothorax or
pleural effusion. The cardiopericardial silhouette is within normal
limits for size. The visualized bony structures of the thorax show
no acute abnormality. Stable tiny granuloma right lung apex.
IMPRESSION: No active cardiopulmonary disease.

## 2021-05-30 MED ORDER — HYDROCODONE BIT-HOMATROP MBR 5-1.5 MG/5ML PO SOLN: 5.0000 mL | Freq: Four times a day (QID) | ORAL | 0 refills | Status: AC | PRN

## 2021-05-30 MED ORDER — DOXYCYCLINE HYCLATE 100 MG PO TABS: 100.0000 mg | ORAL_TABLET | Freq: Two times a day (BID) | ORAL | 0 refills | Status: DC

## 2021-05-30 NOTE — Patient Instructions (Addendum)
Please take all new medication as prescribed - the cough medicine, and antibiotic  You can also take Mucinex (or it's generic off brand) for congestion, and tylenol as needed for pain.  Please continue all other medications as before, and refills have been done if requested.  Please have the pharmacy call with any other refills you may need.  Please continue your efforts at being more active, low cholesterol diet, and weight control.  Please keep your appointments with your specialists as you may have planned  Please go to the XRAY Department in the first floor for the x-ray testing  You will be contacted by phone if any changes need to be made immediately.  Otherwise, you will receive a letter about your results with an explanation, but please check with MyChart first.  Please remember to sign up for MyChart if you have not done so, as this will be important to you in the future with finding out test results, communicating by private email, and scheduling acute appointments online when needed.

## 2021-05-30 NOTE — Progress Notes (Signed)
Patient ID: Dorothy Armstrong, female   DOB: December 31, 1970, 51 y.o.   MRN: [id number]        Chief Complaint: follow up cough since late November 2022       HPI:  Dorothy Armstrong is a 51 y.o. female here with c/o persistent cough wax and wane ever since thanksgiving time nov 2022, just cant seem to shake it, covid neg several times with last check yesterday, was seen at [facility] with antibx and prednisone with improvement and resolved except for occasional lingering non prod cough, then worse again in the last 3-4 days. This time mucinex not helping.  Has also been on nexium otc in the past month without much overall cough improved.  Now here with acute onset mild to mod ST, HA, general weakness and malaise, with prod cough greenish sputum, but Pt denies chest pain, increased sob or doe, wheezing, orthopnea, PND, increased LE swelling, palpitations, dizziness or syncope. Also has intemrittent left and right ear popping and fullness without vertigo, hearing loss tinnitus.   Pt denies polydipsia, polyuria, or new focal neuro s/s.   Pt denies unusual wt loss, night sweats, loss of appetite, or other constitutional symptoms     Wt Readings from Last 3 Encounters:  05/30/21 238 lb (108 kg)  11/09/20 234 lb 12.8 oz (106.5 kg)  08/08/20 230 lb (104.3 kg)   BP Readings from Last 3 Encounters:  05/30/21 118/62  11/09/20 (!) 142/87  08/08/20 116/80         Past Medical History:  Diagnosis Date   Allergy    seasonal allergies   Ankle fracture    LEFT   Arthritis    bilateral knees   Hiatal hernia    IBS (irritable bowel syndrome)    Kidney calculi    kidney stone   Past Surgical History:  Procedure Laterality Date   BACK SURGERY  11/2019   CESAREAN SECTION  2001, 2004   x2   CYSTOSCOPY/RETROGRADE/URETEROSCOPY/STONE EXTRACTION WITH BASKET  2001   WISDOM TOOTH EXTRACTION      reports that she has never smoked. She has never used smokeless tobacco. She reports current alcohol use of about 1.0  standard drink per week. She reports that she does not use drugs. family history includes Breast cancer in her paternal aunt; CVA in her paternal grandfather; Colon cancer (age of onset: 25) in her paternal grandmother; Colon polyps (age of onset: 15) in her paternal grandmother; Hypertension in her father and paternal grandfather. Allergies  Allergen Reactions   Sulfonamide Derivatives    Current Outpatient Medications on File Prior to Visit  Medication Sig Dispense Refill   benzonatate (TESSALON) 100 MG capsule Take 1 capsule (100 mg total) by mouth 3 (three) times daily as needed. 30 capsule 0   guaiFENesin-codeine (ROBITUSSIN AC) 100-10 MG/5ML syrup Take 5-10 mLs by mouth at bedtime as needed for cough. 180 mL 0   ibuprofen (ADVIL) 200 MG tablet daily as needed.     Multiple Vitamins-Minerals (MULTIVITAMIN PO) Take by mouth daily.     PARoxetine (PAXIL) 20 MG tablet Take 1 tablet (20 mg total) by mouth daily. 90 tablet 4   No current facility-administered medications on file prior to visit.        ROS:  All others reviewed and negative.  Objective        PE:  BP 118/62 (BP Location: Right Arm, Patient Position: Sitting, Cuff Size: Large)    Pulse 74  Temp 97.8 F (36.6 C) (Oral)    Ht 5' 2.5" (1.588 m)    Wt 238 lb (108 kg)    SpO2 98%    BMI 42.84 kg/m                 Constitutional: Pt appears in NAD               HENT: Head: NCAT.                Right Ear: External ear normal.                 Left Ear: External ear normal. Bilat tm's with mild erythema.  Max sinus areas mild tender.  Pharynx with mild erythema, no exudate               Eyes: . Pupils are equal, round, and reactive to light. Conjunctivae and EOM are normal               Nose: without d/c or deformity               Neck: Neck supple. Gross normal ROM               Cardiovascular: Normal rate and regular rhythm.                 Pulmonary/Chest: Effort normal and breath sounds without rales or wheezing.                                Neurological: Pt is alert. At baseline orientation, motor grossly intact               Skin: Skin is warm. No rashes, no other new lesions, LE edema - none               Psychiatric: Pt behavior is normal without agitation   Micro: none  Cardiac tracings I have personally interpreted today:  none  Pertinent Radiological findings (summarize): none   Lab Results  Component Value Date   WBC 7.7 06/01/2019   HGB 12.9 06/01/2019   HCT 36.2 06/01/2019   PLT 267 06/01/2019   GLUCOSE 107 (H) 06/01/2019   CHOL 166 06/01/2019   TRIG 123 06/01/2019   HDL 51 06/01/2019   LDLCALC 93 06/01/2019   ALT 19 06/01/2019   AST 17 06/01/2019   NA 139 06/01/2019   K 3.9 06/01/2019   CL 104 06/01/2019   CREATININE 0.63 06/01/2019   BUN 7 06/01/2019   CO2 22 06/01/2019   TSH 2.170 06/01/2019   HGBA1C 5.7 (H) 11/09/2020   Assessment/Plan:  Dorothy Armstrong is a 51 y.o. White or Caucasian [1] female with  has a past medical history of Allergy, Ankle fracture, Arthritis, Hiatal hernia, IBS (irritable bowel syndrome), and Kidney calculi.  URI (upper respiratory infection) Mild to mod, for antibx course, cough med prn, to f/u any worsening symptoms or concerns  Cough Also for f/u cxr given lingering cough since nov 2022,  to f/u any worsening symptoms or concerns   Eustachian tube dysfunction, bilateral Pt encouraged to continue the mucinex bid prn otc,  to f/u any worsening symptoms or concerns  Followup: Return if symptoms worsen or fail to improve.  Cathlean Cower, MD 06/04/2021 6:29 AM [facility]

## 2021-06-04 ENCOUNTER — Encounter: Payer: Self-pay | Admitting: Internal Medicine

## 2021-06-04 DIAGNOSIS — H6983 Other specified disorders of Eustachian tube, bilateral: Secondary | ICD-10-CM | POA: Insufficient documentation

## 2021-06-04 NOTE — Assessment & Plan Note (Signed)
Pt encouraged to continue the mucinex bid prn otc,  to f/u any worsening symptoms or concerns

## 2021-06-04 NOTE — Assessment & Plan Note (Addendum)
Mild to mod, for antibx course, cough med prn, to f/u any worsening symptoms or concerns 

## 2021-06-04 NOTE — Assessment & Plan Note (Signed)
Also for f/u cxr given lingering cough since nov 2022,  to f/u any worsening symptoms or concerns

## 2021-07-03 ENCOUNTER — Encounter (HOSPITAL_BASED_OUTPATIENT_CLINIC_OR_DEPARTMENT_OTHER): Payer: Self-pay | Admitting: *Deleted

## 2021-11-23 ENCOUNTER — Encounter (HOSPITAL_BASED_OUTPATIENT_CLINIC_OR_DEPARTMENT_OTHER): Payer: Self-pay

## 2021-11-23 ENCOUNTER — Ambulatory Visit (HOSPITAL_BASED_OUTPATIENT_CLINIC_OR_DEPARTMENT_OTHER): Payer: Managed Care, Other (non HMO) | Admitting: Obstetrics & Gynecology

## 2021-11-30 ENCOUNTER — Other Ambulatory Visit (HOSPITAL_BASED_OUTPATIENT_CLINIC_OR_DEPARTMENT_OTHER): Payer: Self-pay | Admitting: *Deleted

## 2021-11-30 DIAGNOSIS — Z8659 Personal history of other mental and behavioral disorders: Secondary | ICD-10-CM

## 2021-11-30 MED ORDER — PAROXETINE HCL 20 MG PO TABS: 20.0000 mg | ORAL_TABLET | Freq: Every day | ORAL | 0 refills | Status: DC

## 2022-01-24 ENCOUNTER — Encounter (HOSPITAL_BASED_OUTPATIENT_CLINIC_OR_DEPARTMENT_OTHER): Payer: Self-pay | Admitting: Obstetrics & Gynecology

## 2022-01-24 ENCOUNTER — Ambulatory Visit (HOSPITAL_BASED_OUTPATIENT_CLINIC_OR_DEPARTMENT_OTHER): Payer: Managed Care, Other (non HMO) | Admitting: Obstetrics & Gynecology

## 2022-01-24 ENCOUNTER — Other Ambulatory Visit (HOSPITAL_COMMUNITY)
Admission: RE | Admit: 2022-01-24 | Discharge: 2022-01-24 | Disposition: A | Discharge status: Home / Self Care | Payer: Managed Care, Other (non HMO) | Source: Ambulatory Visit | Attending: Obstetrics & Gynecology | Admitting: Obstetrics & Gynecology

## 2022-01-24 VITALS — BP 153/84 | HR 68 | Ht 62.5 in | Wt 220.2 lb

## 2022-01-24 DIAGNOSIS — Z124 Encounter for screening for malignant neoplasm of cervix: Secondary | ICD-10-CM

## 2022-01-24 DIAGNOSIS — Z01419 Encounter for gynecological examination (general) (routine) without abnormal findings: Secondary | ICD-10-CM

## 2022-01-24 DIAGNOSIS — Z975 Presence of (intrauterine) contraceptive device: Secondary | ICD-10-CM | POA: Diagnosis not present

## 2022-01-24 DIAGNOSIS — F4321 Adjustment disorder with depressed mood: Secondary | ICD-10-CM

## 2022-01-24 DIAGNOSIS — Z8659 Personal history of other mental and behavioral disorders: Secondary | ICD-10-CM | POA: Diagnosis not present

## 2022-01-24 MED ORDER — PAROXETINE HCL 20 MG PO TABS: 20.0000 mg | ORAL_TABLET | Freq: Every day | ORAL | 3 refills | Status: DC

## 2022-01-24 NOTE — Progress Notes (Signed)
51 y.o. G2P2 Married White or Caucasian female here for annual exam.  College age daughter passed this summer from pneumonia.  Was working at [employer].  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  Reports they are the only ones in the group whose child didn't have addiction or suicide as cause.  No sure this is the best fit for them but still going for now.  Has occasional spotting with IUD.  Current mirena was placed 05/29/2019.    No LMP recorded. (Menstrual status: IUD).          The current method of family planning is IUD.    Smoker:  no  Health Maintenance: Pap:  05/29/2019 Negative History of abnormal Pap:  no MMG:  06/29/2021 Negative Colonoscopy:  08/08/2020 , follow up [place]: last doe 10/2020   reports that she has never smoked. She has never used smokeless tobacco. She reports current alcohol use of about 1.0 standard drink of alcohol per week. She reports that she does not use drugs.  Past Medical History:  Diagnosis Date   Allergy    seasonal allergies   Ankle fracture    LEFT   Arthritis    bilateral knees   Hiatal hernia    IBS (irritable bowel syndrome)    Kidney calculi    kidney stone    Past Surgical History:  Procedure Laterality Date   BACK SURGERY  11/2019   CESAREAN SECTION  2001, 2004   x2   CYSTOSCOPY/RETROGRADE/URETEROSCOPY/STONE EXTRACTION WITH BASKET  2001   WISDOM TOOTH EXTRACTION      Current Outpatient Medications  Medication Sig Dispense Refill   ibuprofen (ADVIL) 200 MG tablet daily as needed.     Multiple Vitamins-Minerals (MULTIVITAMIN PO) Take by mouth daily.     PARoxetine (PAXIL) 20 MG tablet Take 1 tablet (20 mg total) by mouth daily. 90 tablet 0   No current facility-administered medications for this visit.    Family History  Problem Relation Age of Onset   Breast cancer Paternal Aunt    Hypertension Father    Hypertension Paternal Grandfather    CVA Paternal Grandfather    Colon  cancer Paternal Grandmother 104   Colon polyps Paternal Grandmother 8   Esophageal cancer Neg Hx    Rectal cancer Neg Hx    Stomach cancer Neg Hx     ROS: Constitutional: negative Genitourinary:negative  Exam:   BP (!) 153/84 (BP Location: Left Arm, Patient Position: Sitting, Cuff Size: Large)   Pulse 68   Ht 5' 2.5" (1.588 m) Comment: Reported  Wt 220 lb 3.2 oz (99.9 kg)   BMI 39.63 kg/m   Height: 5' 2.5" (158.8 cm) (Reported)  General appearance: alert, cooperative and appears stated age Head: Normocephalic, without obvious abnormality, atraumatic Neck: no adenopathy, supple, symmetrical, trachea midline and thyroid normal to inspection and palpation Lungs: clear to auscultation bilaterally Breasts: normal appearance, no masses or tenderness Heart: regular rate and rhythm Abdomen: soft, non-tender; bowel sounds normal; no masses,  no organomegaly Extremities: extremities normal, atraumatic, no cyanosis or edema Skin: Skin color, texture, turgor normal. No rashes or lesions Lymph nodes: Cervical, supraclavicular, and axillary nodes normal. No abnormal inguinal nodes palpated Neurologic: Grossly normal   Pelvic: External genitalia:  no lesions              Urethra:  normal appearing urethra with no masses, tenderness or lesions  Bartholins and Skenes: normal                 Vagina: normal appearing vagina with normal color and no discharge, no lesions              Cervix: no lesions, IUD string noted              Pap taken: Yes.   Bimanual Exam:  Uterus:  normal size, contour, position, consistency, mobility, non-tender              Adnexa: normal adnexa and no mass, fullness, tenderness               Rectovaginal: Confirms               Anus:  normal sphincter tone, no lesions  Chaperone, Octaviano Batty, CMA, was present for exam.  Assessment/Plan: 1. Well woman exam with routine gynecological exam - Pap smear obtained today - Mammogram 06/29/2021 -  Colonoscopy 08/08/2020, follow up 2025 - lab work will be done with PCP - vaccines reviewed/updated  2. Cervical cancer screening - Cytology - PAP( [place])  3. History of depression - PARoxetine (PAXIL) 20 MG tablet; Take 1 tablet (20 mg total) by mouth daily.  Dispense: 90 tablet; Refill: 3  4. IUD (intrauterine device) in place  5. Grief reaction - pt has good support and is grief counseling with spouse  6.  Increased risk of breast cancer (breast cancer in maternal aunt) - lifetime risk 25.5% calculated at [facility].  Had MRI 01/2021

## 2022-01-29 LAB — CYTOLOGY - PAP
Adequacy: ABSENT
Comment: NEGATIVE
Diagnosis: NEGATIVE
High risk HPV: NEGATIVE

## 2022-05-01 ENCOUNTER — Other Ambulatory Visit (HOSPITAL_BASED_OUTPATIENT_CLINIC_OR_DEPARTMENT_OTHER): Payer: Self-pay | Admitting: Obstetrics & Gynecology

## 2022-05-01 DIAGNOSIS — F4321 Adjustment disorder with depressed mood: Secondary | ICD-10-CM

## 2022-05-01 DIAGNOSIS — Z8659 Personal history of other mental and behavioral disorders: Secondary | ICD-10-CM

## 2022-05-01 MED ORDER — PAROXETINE HCL 30 MG PO TABS: 30.0000 mg | ORAL_TABLET | Freq: Every day | ORAL | 0 refills | Status: DC

## 2022-05-28 ENCOUNTER — Other Ambulatory Visit (HOSPITAL_BASED_OUTPATIENT_CLINIC_OR_DEPARTMENT_OTHER): Payer: Self-pay | Admitting: Obstetrics & Gynecology

## 2022-05-28 DIAGNOSIS — Z8659 Personal history of other mental and behavioral disorders: Secondary | ICD-10-CM

## 2022-05-28 DIAGNOSIS — F4321 Adjustment disorder with depressed mood: Secondary | ICD-10-CM

## 2022-05-29 NOTE — Telephone Encounter (Signed)
LMOVM for pt to call regarding prescription refill request

## 2022-05-30 NOTE — Telephone Encounter (Signed)
LMOVM for pt to call regarding refill request 

## 2022-05-31 NOTE — Telephone Encounter (Signed)
LMOVM for pt to call regarding refill request 

## 2022-07-24 ENCOUNTER — Encounter (HOSPITAL_BASED_OUTPATIENT_CLINIC_OR_DEPARTMENT_OTHER): Payer: Self-pay | Admitting: *Deleted

## 2023-01-30 ENCOUNTER — Ambulatory Visit (HOSPITAL_BASED_OUTPATIENT_CLINIC_OR_DEPARTMENT_OTHER): Payer: Managed Care, Other (non HMO) | Admitting: Obstetrics & Gynecology

## 2023-02-23 ENCOUNTER — Other Ambulatory Visit (HOSPITAL_BASED_OUTPATIENT_CLINIC_OR_DEPARTMENT_OTHER): Payer: Self-pay | Admitting: Obstetrics & Gynecology

## 2023-02-23 DIAGNOSIS — Z8659 Personal history of other mental and behavioral disorders: Secondary | ICD-10-CM

## 2023-02-25 NOTE — Telephone Encounter (Signed)
LMOVM for pt to call regarding refill request

## 2023-06-06 ENCOUNTER — Other Ambulatory Visit (HOSPITAL_BASED_OUTPATIENT_CLINIC_OR_DEPARTMENT_OTHER): Payer: Self-pay | Admitting: Obstetrics & Gynecology

## 2023-06-06 DIAGNOSIS — Z8659 Personal history of other mental and behavioral disorders: Secondary | ICD-10-CM

## 2023-06-10 ENCOUNTER — Encounter (HOSPITAL_BASED_OUTPATIENT_CLINIC_OR_DEPARTMENT_OTHER): Payer: Self-pay | Admitting: Obstetrics & Gynecology

## 2023-06-10 ENCOUNTER — Ambulatory Visit (HOSPITAL_BASED_OUTPATIENT_CLINIC_OR_DEPARTMENT_OTHER): Payer: Managed Care, Other (non HMO) | Admitting: Obstetrics & Gynecology

## 2023-06-10 VITALS — BP 138/80 | HR 75 | Ht 62.0 in | Wt 233.8 lb

## 2023-06-10 DIAGNOSIS — Z01419 Encounter for gynecological examination (general) (routine) without abnormal findings: Secondary | ICD-10-CM

## 2023-06-10 DIAGNOSIS — Z9189 Other specified personal risk factors, not elsewhere classified: Secondary | ICD-10-CM

## 2023-06-10 DIAGNOSIS — Z975 Presence of (intrauterine) contraceptive device: Secondary | ICD-10-CM

## 2023-06-10 DIAGNOSIS — Z8659 Personal history of other mental and behavioral disorders: Secondary | ICD-10-CM | POA: Diagnosis not present

## 2023-06-10 DIAGNOSIS — Z23 Encounter for immunization: Secondary | ICD-10-CM | POA: Diagnosis not present

## 2023-06-10 MED ORDER — PAROXETINE HCL 20 MG PO TABS: 20.0000 mg | ORAL_TABLET | Freq: Every day | ORAL | 4 refills | Status: DC

## 2023-06-10 NOTE — Progress Notes (Signed)
53 y.o. G2P2 Married White or Caucasian female here for annual exam.  Has IUD in place that was placed 05/29/2019.    She and her husband are still in group therapy.  Daughter just graduated from [employer] in May.  She is taking a gap year.    Having some tendonitis in her left arm.  Seeing Dr. August Saucer on Wednesday.  Has been icing and stretching.  Has done dry needling.    Is taking paxil 20mg  daily.  Needs RF.    No LMP recorded. (Menstrual status: IUD).          The current method of family planning is none and IUD.      Health Maintenance: Pap:  01/24/2022 Negative, neg HR HPV History of abnormal Pap:  no MMG:  07/05/2022 Negative Colonoscopy:  08/08/2020, follow up due this year.  She is aware.   Screening Labs: 10/2020   reports that she has never smoked. She has never used smokeless tobacco. She reports current alcohol use of about 1.0 standard drink of alcohol per week. She reports that she does not use drugs.  Past Medical History:  Diagnosis Date   Allergy    seasonal allergies   Ankle fracture    LEFT   Arthritis    bilateral knees   Hiatal hernia    IBS (irritable bowel syndrome)    Kidney calculi    kidney stone    Past Surgical History:  Procedure Laterality Date   BACK SURGERY  11/2019   CESAREAN SECTION  2001, 2004   x2   CYSTOSCOPY/RETROGRADE/URETEROSCOPY/STONE EXTRACTION WITH BASKET  2001   WISDOM TOOTH EXTRACTION      Current Outpatient Medications  Medication Sig Dispense Refill   ibuprofen (ADVIL) 200 MG tablet daily as needed.     Multiple Vitamins-Minerals (MULTIVITAMIN PO) Take by mouth daily.     PARoxetine (PAXIL) 20 MG tablet TAKE 1 TABLET BY MOUTH EVERY DAY 90 tablet 0   No current facility-administered medications for this visit.    Family History  Problem Relation Age of Onset   Breast cancer Paternal Aunt    Hypertension Father    Hypertension Paternal Grandfather    CVA Paternal Grandfather    Colon cancer Paternal Grandmother 104    Colon polyps Paternal Grandmother 77   Esophageal cancer Neg Hx    Rectal cancer Neg Hx    Stomach cancer Neg Hx     ROS: Constitutional: negative Genitourinary:negative  Exam:   BP 138/80   Pulse 75   Ht 5\' 2"  (1.575 m)   Wt 233 lb 12.8 oz (106.1 kg)   BMI 42.76 kg/m   Height: 5\' 2"  (157.5 cm)  General appearance: alert, cooperative and appears stated age Head: Normocephalic, without obvious abnormality, atraumatic Neck: no adenopathy, supple, symmetrical, trachea midline and thyroid normal to inspection and palpation Lungs: clear to auscultation bilaterally Breasts: normal appearance, no masses or tenderness Heart: regular rate and rhythm Abdomen: soft, non-tender; bowel sounds normal; no masses,  no organomegaly Extremities: extremities normal, atraumatic, no cyanosis or edema Skin: Skin color, texture, turgor normal. No rashes or lesions Lymph nodes: Cervical, supraclavicular, and axillary nodes normal. No abnormal inguinal nodes palpated Neurologic: Grossly normal   Pelvic: External genitalia:  no lesions              Urethra:  normal appearing urethra with no masses, tenderness or lesions              Bartholins and Skenes:  normal                 Vagina: normal appearing vagina with normal color and no discharge, no lesions              Cervix: no lesions              Pap taken: No. Bimanual Exam:  Uterus:  normal size, contour, position, consistency, mobility, non-tender              Adnexa: normal adnexa and no mass, fullness, tenderness               Rectovaginal: Confirms               Anus:  normal sphincter tone, no lesions  Chaperone, Ina Homes, CMA, was present for exam.  Assessment/Plan: 1. Well woman exam with routine gynecological exam (Primary) - Pap smear neg with neg HR HPV 2023.  Not indicated today. - Mammogram 06/2022 - Colonoscopy 2022 - lab work done with PCP, Dr. Okey Dupre - vaccines reviewed/updated  2. History of depression -  PARoxetine (PAXIL) 20 MG tablet; Take 1 tablet (20 mg total) by mouth daily.  Dispense: 90 tablet; Refill: 4  3. IUD (intrauterine device) in place - placed 2021  4. Increased risk of breast cancer - Pt did breast MRI in 2022.  She and I reviewed again today and she knows to call if desires to have this done.

## 2023-06-12 ENCOUNTER — Encounter: Payer: Self-pay | Admitting: Orthopedic Surgery

## 2023-06-12 ENCOUNTER — Ambulatory Visit: Payer: Managed Care, Other (non HMO) | Admitting: Orthopedic Surgery

## 2023-06-12 ENCOUNTER — Other Ambulatory Visit (INDEPENDENT_AMBULATORY_CARE_PROVIDER_SITE_OTHER): Payer: Self-pay

## 2023-06-12 ENCOUNTER — Other Ambulatory Visit: Payer: Self-pay

## 2023-06-12 DIAGNOSIS — M7712 Lateral epicondylitis, left elbow: Secondary | ICD-10-CM | POA: Diagnosis not present

## 2023-06-12 DIAGNOSIS — M25522 Pain in left elbow: Secondary | ICD-10-CM

## 2023-06-12 NOTE — Progress Notes (Signed)
Office Visit Note   Patient: Dorothy Armstrong           Date of Birth: [birth year]           MRN: [id number] Visit Date: 06/12/2023 Requested by: Myrlene Broker, MD [address] [place],  [place] [address] PCP: Myrlene Broker, MD  Subjective: Chief Complaint  Patient presents with   Left Elbow - Pain    HPI: Dorothy Armstrong is a 53 y.o. female who presents to the office reporting left elbow pain.  Patient reports weakness when she is trying to grab things.  She has tried a brace along with ice physical therapy and dry needling.  Symptoms have been going on for 2 months.  No known injury.  She is right-hand dominant.  Denies any neck pain.  Ibuprofen has not been helpful.  Does report occasional tingling and she does localize the pain around the lateral epicondyle and proximal lateral forearm region.  Describes both weakness and pain associated with ADLs.  Interestingly she had a successful injection for similar problem into the right arm about 10 years ago.  That injection resolved her symptoms..                ROS: All systems reviewed are negative as they relate to the chief complaint within the history of present illness.  Patient denies fevers or chills.  Assessment & Plan: Visit Diagnoses:  1. 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  Ultrasound-guided injection performed today.  We did discuss how the literature suggest that cortisone injections typically are short-lived in terms of their relief.  I think neck step would be PRP injection.  Structurally the common extensor looked relatively free of tendinosis on ultrasound imaging.  She will let us know in 2 weeks on MyChart how she is doing with the injection.  Follow-Up Instructions: No follow-ups on file.   Orders:  Orders Placed This Encounter  Procedures   XR Elbow 2 Views Left   US Guided Needle Placement - No Linked Charges   No orders of the defined types  were placed in this encounter.     Procedures: Hand/UE Inj: L elbow for lateral epicondylitis on 06/12/2023 11:12 AM Indications: therapeutic Details: ultrasound-guided lateral approach Medications: 3 mL lidocaine 1 %; 0.66 mL bupivacaine 0.5 %; 13.33 mg methylPREDNISolone acetate 40 MG/ML Outcome: tolerated well, no immediate complications Procedure, treatment alternatives, risks and benefits explained, specific risks discussed. Immediately prior to procedure a time out was called to verify the correct patient, procedure, equipment, support staff and site/side marked as required. Patient was prepped and draped in the usual sterile fashion.       Clinical Data: No additional findings.  Objective: Vital Signs: There were no vitals taken for this visit.  Physical Exam:  Constitutional: Patient appears well-developed HEENT:  Head: Normocephalic Eyes:EOM are normal Neck: Normal range of motion Cardiovascular: Normal rate Pulmonary/chest: Effort normal Neurologic: Patient is alert Skin: Skin is warm Psychiatric: Patient has normal mood and affect  Ortho Exam: Ortho exam demonstrates good cervical spine range of motion.  5 out of 5 grip EPL FPL interosseous wrist flexion extension biceps triceps and deltoid strength.  Left arm and elbow has full range of motion with elbow pronation supination flexion extension.  Does have tenderness localizing to the mobile wad as well as lateral epicondyle with wrist extension and finger extension.  Not too much difference  in pain between resisted supination and pronation on that left-hand side.  No masses lymphadenopathy or skin changes noted in that left elbow.  Specialty Comments:  No specialty comments available.  Imaging: XR Elbow 2 Views Left Result Date: 06/12/2023 AP lateral radiographs left elbow reviewed.  No acute fracture or dislocation or arthritis.  No enthesopathic changes at the medial or lateral epicondyle  US Guided Needle  Placement - No Linked Charges Result Date: 06/12/2023 Ultrasound imaging demonstrates needle placement into the common extensor origin below the extensor tendon with extravasation of fluid in the area of tendinosis and no complicating features    PMFS History: Patient Active Problem List   Diagnosis Date Noted   Eustachian tube dysfunction, bilateral 06/04/2021   Spondylosis without myelopathy or radiculopathy, lumbar region 02/16/2019   Spondylolisthesis of lumbar region 02/16/2019   Chronic left-sided low back pain with left-sided sciatica 07/01/2018   Right upper quadrant abdominal pain 09/20/2017   Routine general medical examination at a health care facility 01/30/2016   IUD (intrauterine device) in place 08/30/2015   Fatty liver disease, nonalcoholic 11/08/2014   Obesity 11/07/9483   Splenomegaly 11/08/2014   Menorrhagia with regular cycle 08/01/2014   Osteoarthritis 02/05/2008   NEPHROLITHIASIS, HX OF 02/05/2008   Past Medical History:  Diagnosis Date   Allergy    seasonal allergies   Ankle fracture    LEFT   Arthritis    bilateral knees   Hiatal hernia    IBS (irritable bowel syndrome)    Kidney calculi    kidney stone    Family History  Problem Relation Age of Onset   Breast cancer Paternal Aunt    Hypertension Father    Hypertension Paternal Grandfather    CVA Paternal Grandfather    Colon cancer Paternal Grandmother 66   Colon polyps Paternal Grandmother 78   Esophageal cancer Neg Hx    Rectal cancer Neg Hx    Stomach cancer Neg Hx     Past Surgical History:  Procedure Laterality Date   BACK SURGERY  11/2019   CESAREAN SECTION  2001, 2004   x2   CYSTOSCOPY/RETROGRADE/URETEROSCOPY/STONE EXTRACTION WITH BASKET  2001   WISDOM TOOTH EXTRACTION     Social History   Occupational History   Not on file  Tobacco Use   Smoking status: Never   Smokeless tobacco: Never  Vaping Use   Vaping status: Never Used  Substance and Sexual Activity   Alcohol  use: Yes    Alcohol/week: 1.0 standard drink of alcohol    Types: 1 Standard drinks or equivalent per week   Drug use: No   Sexual activity: Yes    Partners: Male    Birth control/protection: Other-see comments, I.U.D.    Comment: vasectomy

## 2023-06-16 MED ORDER — BUPIVACAINE HCL 0.5 % IJ SOLN
0.6600 mL | INTRAMUSCULAR | Status: AC | PRN
Start: 2023-06-12 — End: 2023-06-12
  Administered 2023-06-12: .66 mL

## 2023-06-16 MED ORDER — LIDOCAINE HCL 1 % IJ SOLN
3.0000 mL | INTRAMUSCULAR | Status: AC | PRN
Start: 2023-06-12 — End: 2023-06-12
  Administered 2023-06-12: 3 mL

## 2023-06-16 MED ORDER — METHYLPREDNISOLONE ACETATE 40 MG/ML IJ SUSP
13.3300 mg | INTRAMUSCULAR | Status: AC | PRN
Start: 2023-06-12 — End: 2023-06-12
  Administered 2023-06-12: 13.33 mg

## 2023-07-31 ENCOUNTER — Encounter (HOSPITAL_BASED_OUTPATIENT_CLINIC_OR_DEPARTMENT_OTHER): Payer: Self-pay | Admitting: *Deleted

## 2023-10-10 ENCOUNTER — Telehealth: Payer: Self-pay

## 2023-10-10 ENCOUNTER — Encounter: Payer: Self-pay | Admitting: Orthopedic Surgery

## 2023-10-10 ENCOUNTER — Ambulatory Visit: Admitting: Orthopedic Surgery

## 2023-10-10 DIAGNOSIS — M7712 Lateral epicondylitis, left elbow: Secondary | ICD-10-CM

## 2023-10-10 NOTE — Progress Notes (Signed)
 Office Visit Note   Patient: Dorothy Armstrong           Date of Birth: [birth year]           MRN: [id number] Visit Date: 10/10/2023 Requested by: Adelia Homestead, MD [address] [place],  [place] [address] PCP: Adelia Homestead, MD  Subjective: Chief Complaint  Patient presents with   Left Elbow - Pain    HPI: Dorothy Armstrong is a 53 y.o. female who presents to the office reporting continued left arm pain.  Patient states that her arm has flared up again.  Prior cortisone injection in January gave her good relief for 4 months.  First time she had a shot in that left-hand side.  She is right-hand dominant.  Pain does not wake her from sleep at night.  She does have pain with pulling motions.  Radiographs unremarkable..                ROS: All systems reviewed are negative as they relate to the chief complaint within the history of present illness.  Patient denies fevers or chills.  Assessment & Plan: Visit Diagnoses: No diagnosis found.  Plan: Impression is left arm common extensor tendinosis.  A little bit of crepitus with range of motion today.  Strength is okay.  Do not want to do another cortisone injection.  PRP by the literature would be an appropriate injection but that is going to be expensive and she wants to try ultrasound shot treatment which I think may be beneficial.  Refer to Dr. Vaughn Georges for left elbow lateral common extensor tendinosis treatment.  Follow-up with us  as needed.  Follow-Up Instructions: No follow-ups on file.   Orders:  No orders of the defined types were placed in this encounter.  No orders of the defined types were placed in this encounter.     Procedures: No procedures performed   Clinical Data: No additional findings.  Objective: Vital Signs: There were no vitals taken for this visit.  Physical Exam:  Constitutional: Patient appears well-developed HEENT:  Head: Normocephalic Eyes:EOM are normal Neck: Normal range of  motion Cardiovascular: Normal rate Pulmonary/chest: Effort normal Neurologic: Patient is alert Skin: Skin is warm Psychiatric: Patient has normal mood and affect  Ortho Exam: Ortho exam demonstrates full range of motion of the left arm.  Full pronation supination flexion extension.  Does have a little bit of pain with resisted wrist extension.  No tenderness in the radial tunnel.  Medial epicondyle nontender.  Slight crepitus with range of motion on that left-hand side.  More with flexion extension as opposed to pronation supination.  Does have pain with resisted wrist extension and finger extension but not thumb extension.  Specialty Comments:  No specialty comments available.  Imaging: No results found.   PMFS History: Patient Active Problem List   Diagnosis Date Noted   Eustachian tube dysfunction, bilateral 06/04/2021   Spondylosis without myelopathy or radiculopathy, lumbar region 02/16/2019   Spondylolisthesis of lumbar region 02/16/2019   Chronic left-sided low back pain with left-sided sciatica 07/01/2018   Right upper quadrant abdominal pain 09/20/2017   Routine general medical examination at a health care facility 01/30/2016   IUD (intrauterine device) in place 08/30/2015   Fatty liver disease, nonalcoholic 11/08/2014   Obesity 96/29/5284   Splenomegaly 11/08/2014   Menorrhagia with regular cycle 08/01/2014   Osteoarthritis 02/05/2008   NEPHROLITHIASIS, HX OF 02/05/2008   Past Medical History:  Diagnosis Date   Allergy  seasonal allergies   Ankle fracture    LEFT   Arthritis    bilateral knees   Hiatal hernia    IBS (irritable bowel syndrome)    Kidney calculi    kidney stone    Family History  Problem Relation Age of Onset   Breast cancer Paternal Aunt    Hypertension Father    Hypertension Paternal Grandfather    CVA Paternal Grandfather    Colon cancer Paternal Grandmother 52   Colon polyps Paternal Grandmother 75   Esophageal cancer Neg Hx     Rectal cancer Neg Hx    Stomach cancer Neg Hx     Past Surgical History:  Procedure Laterality Date   BACK SURGERY  11/2019   CESAREAN SECTION  2001, 2004   x2   CYSTOSCOPY/RETROGRADE/URETEROSCOPY/STONE EXTRACTION WITH BASKET  2001   WISDOM TOOTH EXTRACTION     Social History   Occupational History   Not on file  Tobacco Use   Smoking status: Never   Smokeless tobacco: Never  Vaping Use   Vaping status: Never Used  Substance and Sexual Activity   Alcohol use: Yes    Alcohol/week: 1.0 standard drink of alcohol    Types: 1 Standard drinks or equivalent per week   Drug use: No   Sexual activity: Yes    Partners: Male    Birth control/protection: Other-see comments, I.U.D.    Comment: vasectomy

## 2023-10-10 NOTE — Telephone Encounter (Signed)
 Patient needing an appointment with Dr Vaughn Georges for left elbow LE-shockwave therapy. Can you please reach out to her?

## 2023-11-05 ENCOUNTER — Ambulatory Visit: Admitting: Sports Medicine

## 2023-11-05 DIAGNOSIS — M25522 Pain in left elbow: Secondary | ICD-10-CM

## 2023-11-05 DIAGNOSIS — M7712 Lateral epicondylitis, left elbow: Secondary | ICD-10-CM

## 2023-11-05 DIAGNOSIS — G8929 Other chronic pain: Secondary | ICD-10-CM

## 2023-11-05 NOTE — Progress Notes (Unsigned)
 Patient says that she has had bilateral elbow pain, with the right elbow being years ago, and the left elbow being since January of this year. She says that she had a steroid injection into the right elbow years ago that alleviated her pain, and she has not had pain since then. She had a steroid injection for the left elbow in January that gave her relief until mid April. Her pain is over the lateral epicondyle and into the extensors. She has tried ice, Ibuprofen, and a strap for the arm. She is right hand dominant. She says that she is open to new treatments. PRP was mentioned to her as an option, but she would like to try other options first, and learn more about the PRP injection.  Patient was instructed in 10 minutes of therapeutic exercises for left elbow to improve strength, ROM and function according to my instructions and plan of care by a Certified Athletic Trainer during the office visit. A customized handout was provided and demonstration of proper technique shown and discussed. Patient did perform exercises and demonstrate understanding through teachback.  All questions discussed and answered.

## 2023-11-05 NOTE — Progress Notes (Unsigned)
 Dorothy Armstrong - 53 y.o. female MRN [id number]  Date of birth: Oct 07, 1970  Office Visit Note: Visit Date: 11/05/2023 PCP: Rollene Almarie LABOR, MD Referred by: Rollene Almarie LABOR, *  Subjective: Chief Complaint  Patient presents with  . Left Elbow - Pain   HPI: Dorothy Armstrong is a pleasant 53 y.o. female who presents today for ***  Pertinent ROS were reviewed with the patient and found to be negative unless otherwise specified above in HPI.   Assessment & Plan: Visit Diagnoses: No diagnosis found.  Plan: ***  Follow-up: No follow-ups on file.   Meds & Orders: No orders of the defined types were placed in this encounter.  No orders of the defined types were placed in this encounter.    Procedures: No procedures performed      Clinical History: No specialty comments available.  She reports that she has never smoked. She has never used smokeless tobacco. No results for input(s): HGBA1C, LABURIC in the last 8760 hours.  Objective:   Vital Signs: There were no vitals taken for this visit.  Physical Exam  Gen: Well-appearing, in no acute distress; non-toxic CV: Well-perfused. Warm.  Resp: Breathing unlabored on room air; no wheezing. Psych: Fluid speech in conversation; appropriate affect; normal thought process  Ortho Exam - ***  Imaging: No results found.  Past Medical/Family/Surgical/Social History: Medications & Allergies reviewed per EMR, new medications updated. Patient Active Problem List   Diagnosis Date Noted  . Eustachian tube dysfunction, bilateral 06/04/2021  . Spondylosis without myelopathy or radiculopathy, lumbar region 02/16/2019  . Spondylolisthesis of lumbar region 02/16/2019  . Chronic left-sided low back pain with left-sided sciatica 07/01/2018  . Right upper quadrant abdominal pain 09/20/2017  . Routine general medical examination at a health care facility 01/30/2016  . IUD (intrauterine device) in place 08/30/2015  . Fatty liver  disease, nonalcoholic 11/08/2014  . Obesity 11/08/2014  . Splenomegaly 11/08/2014  . Menorrhagia with regular cycle 08/01/2014  . Osteoarthritis 02/05/2008  . NEPHROLITHIASIS, HX OF 02/05/2008   Past Medical History:  Diagnosis Date  . Allergy    seasonal allergies  . Ankle fracture    LEFT  . Arthritis    bilateral knees  . Hiatal hernia   . IBS (irritable bowel syndrome)   . Kidney calculi    kidney stone   Family History  Problem Relation Age of Onset  . Breast cancer Paternal Aunt   . Hypertension Father   . Hypertension Paternal Grandfather   . CVA Paternal Grandfather   . Colon cancer Paternal Grandmother 104  . Colon polyps Paternal Grandmother 104  . Esophageal cancer Neg Hx   . Rectal cancer Neg Hx   . Stomach cancer Neg Hx    Past Surgical History:  Procedure Laterality Date  . BACK SURGERY  11/2019  . CESAREAN SECTION  2001, 2004   x2  . CYSTOSCOPY/RETROGRADE/URETEROSCOPY/STONE EXTRACTION WITH BASKET  2001  . WISDOM TOOTH EXTRACTION     Social History   Occupational History  . Not on file  Tobacco Use  . Smoking status: Never  . Smokeless tobacco: Never  Vaping Use  . Vaping status: Never Used  Substance and Sexual Activity  . Alcohol use: Yes    Alcohol/week: 1.0 standard drink of alcohol    Types: 1 Standard drinks or equivalent per week  . Drug use: No  . Sexual activity: Yes    Partners: Male    Birth control/protection: Other-see comments, I.U.D.  Comment: vasectomy

## 2023-11-06 ENCOUNTER — Encounter: Payer: Self-pay | Admitting: Sports Medicine

## 2023-11-13 ENCOUNTER — Encounter: Payer: Self-pay | Admitting: Sports Medicine

## 2023-11-13 ENCOUNTER — Ambulatory Visit: Admitting: Sports Medicine

## 2023-11-13 DIAGNOSIS — M7712 Lateral epicondylitis, left elbow: Secondary | ICD-10-CM | POA: Diagnosis not present

## 2023-11-13 NOTE — Progress Notes (Signed)
 Dorothy Armstrong - 53 y.o. female MRN [id number]  Date of birth: [birth year]  Office Visit Note: Visit Date: 11/13/2023 PCP: Rollene Almarie LABOR, MD Referred by: Rollene Almarie LABOR, *  Subjective: Chief Complaint  Patient presents with   Left Elbow - Pain, Follow-up    Patient continues to have issues with her left elbow, but does feel that shockwave therapy may have helped some. She takes ibuprofen for pain.  She is unsure if this helps.    HPI: Dorothy Armstrong is a pleasant 53 y.o. female who presents today for follow-up of left elbow epicondylitis.  She did tolerate the first treatment of extracorporeal shockwave therapy, did not have much soreness.  She still has pain in the elbow but does feel like the initial treatment may have helped some.  She is using about 400 mg of ibuprofen daily as needed.  Is tolerating her home exercise regimen well, performing daily.  Pertinent ROS were reviewed with the patient and found to be negative unless otherwise specified above in HPI.   Assessment & Plan: Visit Diagnoses:  1. Lateral epicondylitis of left elbow    Plan: Impression is lateral epicondylitis on the left elbow in a right-hand-dominant female.  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  Did receive a mild response to our first treatment of extracorporeal shockwave therapy last week, we did repeat treatment today.  She may continue her ibuprofen only as needed.  She will follow-up next week for a repeat shockwave treatment and at that point then we will reassess what sort of cumulative benefit she is having before discussing further treatments. Continue home rehab daily.  Follow-up: Return in about 1 week (around 11/20/2023).   Meds & Orders: No orders of the defined types were placed in this encounter.  No orders of the defined types were placed in this encounter.    Procedures: Procedure:  ECSWT Indications:  Lateral epicondylitis   Procedure Details Consent: Risks of procedure as well as the alternatives and risks of each were explained to the patient.  Verbal consent for procedure obtained. Time Out: Verified patient identification, verified procedure, site was marked, verified correct patient position. The area was cleaned with alcohol swab.     The left lateral epicondyle and CET was targeted for Extracorporeal shockwave therapy.    Preset: Lateral epicondylitis Power Level: 100 mj (60-70 at LE)  Frequency: 11 Hz Impulse/cycles: 2400 (+ 500 at LE directly) Head size: Regular   Patient tolerated procedure well without immediate complications.      Clinical History: No specialty comments available.  She reports that she has never smoked. She has never used smokeless tobacco. No results for input(s): HGBA1C, LABURIC in the last 8760 hours.  Objective:    Physical Exam  Gen: Well-appearing, in no acute distress; non-toxic CV: Well-perfused. Warm.  Resp: Breathing unlabored on room air; no wheezing. Psych: Fluid speech in conversation; appropriate affect; normal thought process  Ortho Exam - Left elbow: Mild TTP directly over the lateral epicondyle.  There is no redness swelling or effusion at this location.  Mild pain with Maudsley's test and third finger extension test but improved from previous visit.  Imaging: No results found.  Past Medical/Family/Surgical/Social History: Medications & Allergies reviewed per EMR, new medications updated. Patient Active Problem List   Diagnosis Date Noted   Eustachian tube dysfunction, bilateral 06/04/2021   Spondylosis without myelopathy or radiculopathy,  lumbar region 02/16/2019   Spondylolisthesis of lumbar region 02/16/2019   Chronic left-sided low back pain with left-sided sciatica 07/01/2018   Right upper quadrant abdominal pain 09/20/2017   Routine general medical examination at a health care facility  01/30/2016   IUD (intrauterine device) in place 08/30/2015   Fatty liver disease, nonalcoholic 11/08/2014   Obesity 93/72/7983   Splenomegaly 11/08/2014   Menorrhagia with regular cycle 08/01/2014   Osteoarthritis 02/05/2008   NEPHROLITHIASIS, HX OF 02/05/2008   Past Medical History:  Diagnosis Date   Allergy    seasonal allergies   Ankle fracture    LEFT   Arthritis    bilateral knees   Hiatal hernia    IBS (irritable bowel syndrome)    Kidney calculi    kidney stone   Family History  Problem Relation Age of Onset   Breast cancer Paternal Aunt    Hypertension Father    Hypertension Paternal Grandfather    CVA Paternal Grandfather    Colon cancer Paternal Grandmother 104   Colon polyps Paternal Grandmother 56   Esophageal cancer Neg Hx    Rectal cancer Neg Hx    Stomach cancer Neg Hx    Past Surgical History:  Procedure Laterality Date   BACK SURGERY  11/2019   CESAREAN SECTION  2001, 2004   x2   CYSTOSCOPY/RETROGRADE/URETEROSCOPY/STONE EXTRACTION WITH BASKET  2001   WISDOM TOOTH EXTRACTION     Social History   Occupational History   Not on file  Tobacco Use   Smoking status: Never   Smokeless tobacco: Never  Vaping Use   Vaping status: Never Used  Substance and Sexual Activity   Alcohol use: Yes    Alcohol/week: 1.0 standard drink of alcohol    Types: 1 Standard drinks or equivalent per week   Drug use: No   Sexual activity: Yes    Partners: Male    Birth control/protection: Other-see comments, I.U.D.    Comment: vasectomy

## 2023-11-19 ENCOUNTER — Ambulatory Visit: Admitting: Sports Medicine

## 2023-11-19 ENCOUNTER — Encounter: Payer: Self-pay | Admitting: Sports Medicine

## 2023-11-19 DIAGNOSIS — M7712 Lateral epicondylitis, left elbow: Secondary | ICD-10-CM

## 2023-11-19 DIAGNOSIS — G8929 Other chronic pain: Secondary | ICD-10-CM | POA: Diagnosis not present

## 2023-11-19 DIAGNOSIS — M25522 Pain in left elbow: Secondary | ICD-10-CM | POA: Diagnosis not present

## 2023-11-19 MED ORDER — NITROGLYCERIN 0.2 MG/HR TD PT24: MEDICATED_PATCH | TRANSDERMAL | 0 refills | Status: AC

## 2023-11-19 NOTE — Progress Notes (Signed)
 Dorothy Armstrong - 53 y.o. female MRN [id number]  Date of birth: 10-Jan-1971  Office Visit Note: Visit Date: 11/19/2023 PCP: Rollene Almarie LABOR, MD Referred by: Rollene Almarie LABOR, *  Subjective: Chief Complaint  Patient presents with   Left Elbow - Follow-up   HPI: Dorothy Armstrong is a pleasant 53 y.o. female who presents today for follow-up of left elbow epicondylitis.  Dorothy Armstrong has had 2 treatments of extracorporeal shockwave therapy.  She does notice improvements as she is feeling a bit better with each treatment.  Overall she feels like she is about 20% improved.  She did have a mild flareup of her pain yesterday and did wear her brace as well as sleep in it.  She continues with her home exercises doing those on a consistent basis.  She is not having pain with exercises.  Pertinent ROS were reviewed with the patient and found to be negative unless otherwise specified above in HPI.   Assessment & Plan: Visit Diagnoses:  1. Lateral epicondylitis of left elbow   2. Chronic elbow pain, left    Plan: Impression is present but improving lateral epicondylitis of the left elbow.  She has received some relief from 2 treatments of extracorporeal shockwave therapy.  We did repeat an additional treatment today.  To help augment healing and speed along her improvement, we discussed adding nitroglycerin  patch protocol, this was printed out and reviewed with her in the room today.  She will continue her home exercises on a daily basis.  Okay for over-the-counter anti-inflammatories as needed.  We will follow-up in 2 weeks for repeat evaluation and see what sort of cumulative benefit she is receiving from above.  Follow-up: Return in about 2 weeks (around 12/03/2023) for L-elbow (reg visit).   Meds & Orders:  Meds ordered this encounter  Medications   nitroGLYCERIN  (NITRODUR - DOSED IN MG/24 HR) 0.2 mg/hr patch    Sig: Cut patch into fourths. Apply 1/4 patch to affected area on elbow/forearm.  Change every 24 hours.    Dispense:  24 patch    Refill:  0   No orders of the defined types were placed in this encounter.    Procedures: Procedure: ECSWT Indications:  Lateral epicondylitis   Procedure Details Consent: Risks of procedure as well as the alternatives and risks of each were explained to the patient.  Verbal consent for procedure obtained. Time Out: Verified patient identification, verified procedure, site was marked, verified correct patient position. The area was cleaned with alcohol swab.     The left lateral epicondyle and CET was targeted for Extracorporeal shockwave therapy.    Preset: Lateral epicondylitis Power Level: 110 mj (60-70 at LE)   Frequency: 12 Hz Impulse/cycles: 2500 (+ 500 at LE directly) Head size: Regular   Patient tolerated procedure well without immediate complications.       Clinical History: No specialty comments available.  She reports that she has never smoked. She has never used smokeless tobacco. No results for input(s): HGBA1C, LABURIC in the last 8760 hours.  Objective:    Physical Exam  Gen: Well-appearing, in no acute distress; non-toxic CV: Well-perfused. Warm.  Resp: Breathing unlabored on room air; no wheezing. Psych: Fluid speech in conversation; appropriate affect; normal thought process  Ortho Exam - Left elbow: No redness swelling or effusion of the elbow.  There is mild but improved tenderness around the lateral epicondyle and the common extensor and tendon origin.  Mild pain with Maudsley's test, improved pain  and strength with resisted wrist extension.  Imaging: No results found.  Past Medical/Family/Surgical/Social History: Medications & Allergies reviewed per EMR, new medications updated. Patient Active Problem List   Diagnosis Date Noted   Eustachian tube dysfunction, bilateral 06/04/2021   Spondylosis without myelopathy or radiculopathy, lumbar region 02/16/2019   Spondylolisthesis of lumbar region  02/16/2019   Chronic left-sided low back pain with left-sided sciatica 07/01/2018   Right upper quadrant abdominal pain 09/20/2017   Routine general medical examination at a health care facility 01/30/2016   IUD (intrauterine device) in place 08/30/2015   Fatty liver disease, nonalcoholic 11/08/2014   Obesity 93/72/7983   Splenomegaly 11/08/2014   Menorrhagia with regular cycle 08/01/2014   Osteoarthritis 02/05/2008   NEPHROLITHIASIS, HX OF 02/05/2008   Past Medical History:  Diagnosis Date   Allergy    seasonal allergies   Ankle fracture    LEFT   Arthritis    bilateral knees   Hiatal hernia    IBS (irritable bowel syndrome)    Kidney calculi    kidney stone   Family History  Problem Relation Age of Onset   Breast cancer Paternal Aunt    Hypertension Father    Hypertension Paternal Grandfather    CVA Paternal Grandfather    Colon cancer Paternal Grandmother 104   Colon polyps Paternal Grandmother 64   Esophageal cancer Neg Hx    Rectal cancer Neg Hx    Stomach cancer Neg Hx    Past Surgical History:  Procedure Laterality Date   BACK SURGERY  11/2019   CESAREAN SECTION  2001, 2004   x2   CYSTOSCOPY/RETROGRADE/URETEROSCOPY/STONE EXTRACTION WITH BASKET  2001   WISDOM TOOTH EXTRACTION     Social History   Occupational History   Not on file  Tobacco Use   Smoking status: Never   Smokeless tobacco: Never  Vaping Use   Vaping status: Never Used  Substance and Sexual Activity   Alcohol use: Yes    Alcohol/week: 1.0 standard drink of alcohol    Types: 1 Standard drinks or equivalent per week   Drug use: No   Sexual activity: Yes    Partners: Male    Birth control/protection: Other-see comments, I.U.D.    Comment: vasectomy

## 2023-11-19 NOTE — Progress Notes (Signed)
 Patient says that she is overall a bit better from the shockwave therapy, although she did have a flare up of pain yesterday. She says that she wore her brace yesterday and slept in it, and took it off this morning. Her pain has decreased some today in comparison to yesterday. She continues to do her home exercises, which are going well.

## 2023-12-03 ENCOUNTER — Ambulatory Visit: Admitting: Sports Medicine

## 2023-12-03 ENCOUNTER — Encounter: Payer: Self-pay | Admitting: Sports Medicine

## 2023-12-03 DIAGNOSIS — M7712 Lateral epicondylitis, left elbow: Secondary | ICD-10-CM

## 2023-12-03 DIAGNOSIS — M25522 Pain in left elbow: Secondary | ICD-10-CM | POA: Diagnosis not present

## 2023-12-03 DIAGNOSIS — G8929 Other chronic pain: Secondary | ICD-10-CM

## 2023-12-03 NOTE — Progress Notes (Signed)
 Dorothy Armstrong - 53 y.o. female MRN [id number]  Date of birth: 1970-05-28  Office Visit Note: Visit Date: 12/03/2023 PCP: Rollene Almarie LABOR, MD Referred by: Rollene Almarie LABOR, *  Subjective: Chief Complaint  Patient presents with   Left Elbow - Follow-up   HPI: Dorothy Armstrong is a pleasant 53 y.o. female who presents today for follow-up of left elbow lateral epicondylitis.  So far we have performed 3 treatments of extracorporeal shockwave therapy, she continues noting good improvement as she feels she is at least 50% better at this time.  She is tolerating the nitroglycerin  patch without side effects and is changing this once daily.  Continues being consistent with her home exercises. Overall doing better, this past week her symptoms somewhat flared up as she was very active at work doing a lot of work getting ready for the end of summer at [employer].   Pertinent ROS were reviewed with the patient and found to be negative unless otherwise specified above in HPI.   Assessment & Plan: Visit Diagnoses:  1. Lateral epicondylitis of left elbow   2. Chronic elbow pain, left    Plan: Impression is chronic left elbow pain with lateral epicondylitis which has been improving with extracorporeal shockwave therapy and augmentation of nitroglycerin  patch protocol.  We did repeat these ESWT today, patient tolerated well.  She is leaving for vacation here soon, I believe this is a good time to give her a prolonged break to see what sort of cumulative benefit she has going forward after shockwave while she continues the nitroglycerin  patch 0.2mg /hr protocol.  Discussed the importance of continuing her home exercise regimen on a daily basis.  We will see her back in the next 3-4 weeks to see what sort of cumulative benefit she receives.  Could consider a few additional shockwave treatments only as needed.  Additional considerations: 1x additional CSI vs. Prolotherapy injection  Follow-up: F/u in 3-4  weeks    Meds & Orders:    - Continue:   nitroGLYCERIN  (NITRODUR - DOSED IN MG/24 HR) 0.2 mg/hr patch       Sig: Cut patch into fourths. Apply 1/4 patch to affected area on elbow/forearm. Change every 24 hours.     Procedures: Procedure: ECSWT Indications:  Lateral epicondylitis   Procedure Details Consent: Risks of procedure as well as the alternatives and risks of each were explained to the patient.  Verbal consent for procedure obtained. Time Out: Verified patient identification, verified procedure, site was marked, verified correct patient position. The area was cleaned with alcohol swab.     The left lateral epicondyle and CET was targeted for Extracorporeal shockwave therapy.    Preset: Lateral epicondylitis Power Level: 110 mj (70 at LE)   Frequency: 12 Hz Impulse/cycles: 2500 (+ 500 at LE directly) Head size: Regular   Patient tolerated procedure well without immediate complications.        Clinical History: No specialty comments available.  She reports that she has never smoked. She has never used smokeless tobacco. No results for input(s): HGBA1C, LABURIC in the last 8760 hours.  Objective:    Physical Exam  Gen: Well-appearing, in no acute distress; non-toxic CV: Well-perfused. Warm.  Resp: Breathing unlabored on room air; no wheezing. Psych: Fluid speech in conversation; appropriate affect; normal thought process  Ortho Exam - Left elbow: There is only minimal tenderness about the lateral epicondyle and CET origin.  There is good range of motion at the elbow and wrist joint  with flexion and extension.  No swelling or effusion of the elbow.  Imaging: No results found.  Past Medical/Family/Surgical/Social History: Medications & Allergies reviewed per EMR, new medications updated. Patient Active Problem List   Diagnosis Date Noted   Eustachian tube dysfunction, bilateral 06/04/2021   Spondylosis without myelopathy or radiculopathy, lumbar region  02/16/2019   Spondylolisthesis of lumbar region 02/16/2019   Chronic left-sided low back pain with left-sided sciatica 07/01/2018   Right upper quadrant abdominal pain 09/20/2017   Routine general medical examination at a health care facility 01/30/2016   IUD (intrauterine device) in place 08/30/2015   Fatty liver disease, nonalcoholic 11/08/2014   Obesity 93/72/7983   Splenomegaly 11/08/2014   Menorrhagia with regular cycle 08/01/2014   Osteoarthritis 02/05/2008   NEPHROLITHIASIS, HX OF 02/05/2008   Past Medical History:  Diagnosis Date   Allergy    seasonal allergies   Ankle fracture    LEFT   Arthritis    bilateral knees   Hiatal hernia    IBS (irritable bowel syndrome)    Kidney calculi    kidney stone   Family History  Problem Relation Age of Onset   Breast cancer Paternal Aunt    Hypertension Father    Hypertension Paternal Grandfather    CVA Paternal Grandfather    Colon cancer Paternal Grandmother 104   Colon polyps Paternal Grandmother 54   Esophageal cancer Neg Hx    Rectal cancer Neg Hx    Stomach cancer Neg Hx    Past Surgical History:  Procedure Laterality Date   BACK SURGERY  11/2019   CESAREAN SECTION  2001, 2004   x2   CYSTOSCOPY/RETROGRADE/URETEROSCOPY/STONE EXTRACTION WITH BASKET  2001   WISDOM TOOTH EXTRACTION     Social History   Occupational History   Not on file  Tobacco Use   Smoking status: Never   Smokeless tobacco: Never  Vaping Use   Vaping status: Never Used  Substance and Sexual Activity   Alcohol use: Yes    Alcohol/week: 1.0 standard drink of alcohol    Types: 1 Standard drinks or equivalent per week   Drug use: No   Sexual activity: Yes    Partners: Male    Birth control/protection: Other-see comments, I.U.D.    Comment: vasectomy

## 2023-12-03 NOTE — Progress Notes (Signed)
 Patient says that she does notice overall improvements. She had one day last week where her pain flared up, but she does remember doing more that day that required use of her arms/hands. She has been using the patches for 1-2 weeks. She denies any significant soreness/tenderness after the last shockwave therapy.

## 2023-12-24 ENCOUNTER — Ambulatory Visit: Admitting: Sports Medicine

## 2023-12-24 ENCOUNTER — Encounter: Payer: Self-pay | Admitting: Sports Medicine

## 2023-12-24 DIAGNOSIS — G8929 Other chronic pain: Secondary | ICD-10-CM | POA: Diagnosis not present

## 2023-12-24 DIAGNOSIS — M7712 Lateral epicondylitis, left elbow: Secondary | ICD-10-CM

## 2023-12-24 DIAGNOSIS — M25522 Pain in left elbow: Secondary | ICD-10-CM

## 2023-12-24 NOTE — Progress Notes (Signed)
 Patient says that she has been doing well and has noticed improvements with the shockwave therapy. She did a lot of hiking with hiking sticks on her trip, as well as mini golf, and says that the elbow did flare up with these activities. Her pain did go away when it would flare up. She says that she forgot to bring her patches with her on her trip, so she did not use them while she was away.

## 2023-12-24 NOTE — Progress Notes (Signed)
 [name] - 53 y.o. female MRN [id number]  Date of birth: Dec 29, 1970  Office Visit Note: Visit Date: 12/24/2023 PCP: Rollene Almarie LABOR, MD Referred by: Rollene Almarie LABOR, *  Subjective: Chief Complaint  Patient presents with   Left Elbow - Follow-up   HPI: Dorothy Armstrong is a pleasant 53 y.o. female who presents today for follow-up of left lateral epicondylitis.  Dorothy Armstrong has certainly been making improvements with her left elbow.  We have performed 4 total treatments of extracorporeal shockwave therapy, with continued improvement with each treatment.  Over the last few weeks she has been using nitroglycerin  patch protocol to augment tendon healing and blood flow augmentation, tolerating this well.  She is not 100% better but feels she is at least 65-70% improved overall.  She does not notice her pain is much on a daily basis, no longer having pain with lifting up the sheets.  Recently returned on a trip from [place] , did have some mild discomfort with her walking poles.  Infrequent anti-inflammatory use.  She has been quite persistent with her home exercises on a near daily basis.  Pertinent ROS were reviewed with the patient and found to be negative unless otherwise specified above in HPI.   Assessment & Plan: Visit Diagnoses:  1. Lateral epicondylitis of left elbow   2. Chronic elbow pain, left    Plan: Impression is chronic left elbow pain secondary to lateral epicondylitis which has been making rather significant improvement with extracorporeal shockwave therapy as well as augmentation of nitroglycerin  patch protocol.  At this point, we did proceed with her final treatment of ESWT, patient tolerated well.  Given her overall improvement, we will take a 4 to 6-week holiday in terms of shockwave treatment and let her continue with her nitroglycerin  patch protocol changing once daily.  I would like to see what sort of cumulative benefit she is receiving from this as well as being  very diligent continuing her home exercise stretches and strengthening rehab over this time.  She will send me a MyChart message in about 5 weeks discussing her degree of improvement (0-100%).  This will guide our next treatment steps which could include a few additional treatments of shockwave only, prolotherapy injection vs. One-additional CSI injection.  When she is active with repetitive gripping/upper extremity activity, she may wear her counterforce strap.  Okay for over-the-counter anti-inflammatories as needed.  Follow-up: Return in about 5 weeks (around 01/28/2024) for send me mychart message of progress/update.   Meds & Orders: No orders of the defined types were placed in this encounter.  No orders of the defined types were placed in this encounter.    Procedures: Procedure: ECSWT Indications:  Lateral epicondylitis   Procedure Details Consent: Risks of procedure as well as the alternatives and risks of each were explained to the patient.  Verbal consent for procedure obtained. Time Out: Verified patient identification, verified procedure, site was marked, verified correct patient position. The area was cleaned with alcohol swab.     The left lateral epicondyle and CET was targeted for Extracorporeal shockwave therapy.    Preset: Lateral epicondylitis Power Level: 110 mj (70 at LE)   Frequency: 12 Hz Impulse/cycles: 2500 (+ 500 at LE directly) Head size: Regular   Patient tolerated procedure well without immediate complications.        Clinical History: No specialty comments available.  She reports that she has never smoked. She has never used smokeless tobacco. No results for input(s): HGBA1C, LABURIC  in the last 8760 hours.  Objective:    Physical Exam  Gen: Well-appearing, in no acute distress; non-toxic CV: Well-perfused. Warm.  Resp: Breathing unlabored on room air; no wheezing. Psych: Fluid speech in conversation; appropriate affect; normal thought  process  Ortho Exam - Left elbow: Nitroglycerin  patch in place over lateral elbow.   Imaging: No results found.  Past Medical/Family/Surgical/Social History: Medications & Allergies reviewed per EMR, new medications updated. Patient Active Problem List   Diagnosis Date Noted   Eustachian tube dysfunction, bilateral 06/04/2021   Spondylosis without myelopathy or radiculopathy, lumbar region 02/16/2019   Spondylolisthesis of lumbar region 02/16/2019   Chronic left-sided low back pain with left-sided sciatica 07/01/2018   Right upper quadrant abdominal pain 09/20/2017   Routine general medical examination at a health care facility 01/30/2016   IUD (intrauterine device) in place 08/30/2015   Fatty liver disease, nonalcoholic 11/08/2014   Obesity 93/72/7983   Splenomegaly 11/08/2014   Menorrhagia with regular cycle 08/01/2014   Osteoarthritis 02/05/2008   NEPHROLITHIASIS, HX OF 02/05/2008   Past Medical History:  Diagnosis Date   Allergy    seasonal allergies   Ankle fracture    LEFT   Arthritis    bilateral knees   Hiatal hernia    IBS (irritable bowel syndrome)    Kidney calculi    kidney stone   Family History  Problem Relation Age of Onset   Breast cancer Paternal Aunt    Hypertension Father    Hypertension Paternal Grandfather    CVA Paternal Grandfather    Colon cancer Paternal Grandmother 104   Colon polyps Paternal Grandmother 109   Esophageal cancer Neg Hx    Rectal cancer Neg Hx    Stomach cancer Neg Hx    Past Surgical History:  Procedure Laterality Date   BACK SURGERY  11/2019   CESAREAN SECTION  2001, 2004   x2   CYSTOSCOPY/RETROGRADE/URETEROSCOPY/STONE EXTRACTION WITH BASKET  2001   WISDOM TOOTH EXTRACTION     Social History   Occupational History   Not on file  Tobacco Use   Smoking status: Never   Smokeless tobacco: Never  Vaping Use   Vaping status: Never Used  Substance and Sexual Activity   Alcohol use: Yes    Alcohol/week: 1.0  standard drink of alcohol    Types: 1 Standard drinks or equivalent per week   Drug use: No   Sexual activity: Yes    Partners: Male    Birth control/protection: Other-see comments, I.U.D.    Comment: vasectomy

## 2024-02-27 ENCOUNTER — Encounter: Payer: Self-pay | Admitting: Sports Medicine

## 2024-02-27 ENCOUNTER — Ambulatory Visit: Admitting: Sports Medicine

## 2024-02-27 DIAGNOSIS — M25522 Pain in left elbow: Secondary | ICD-10-CM | POA: Diagnosis not present

## 2024-02-27 DIAGNOSIS — G8929 Other chronic pain: Secondary | ICD-10-CM

## 2024-02-27 DIAGNOSIS — M7712 Lateral epicondylitis, left elbow: Secondary | ICD-10-CM | POA: Diagnosis not present

## 2024-02-27 NOTE — Progress Notes (Signed)
 Patient says that she is doing well. She still wears her nitroglycerin  patches. She only has pain in the elbow if she has done a lot with the arm/hands, but this pain is nowhere near the severity that it was previously.

## 2024-02-27 NOTE — Progress Notes (Signed)
 Dorothy Armstrong - 53 y.o. female MRN [id number]  Date of birth: Oct 19, 1970  Office Visit Note: Visit Date: 02/27/2024 PCP: Rollene Almarie LABOR, MD Referred by: Rollene Almarie LABOR, *  Subjective: Chief Complaint  Patient presents with   Left Elbow - Follow-up   HPI: Dorothy Armstrong is a pleasant 53 y.o. female who presents today for follow-up of left elbow lateral epicondylitis.  Jia reports the elbow is doing rather well, we did complete 5 treatments of extracorporeal shockwave therapy, the last being on 12/24/2023. At this point she feels about 85% improved.  Not having much pain on a daily basis, not limiting her activity but when she does have a busy day with a lot of typing or repetitive activity she will feel some mild discomfort but nothing like previous.  She has continued her nitroglycerin  patch.  She does wear her counterforce strap only when needed.  She is performing her stretching and home rehab on a nearly daily basis.  Pertinent ROS were reviewed with the patient and found to be negative unless otherwise specified above in HPI.   Assessment & Plan: Visit Diagnoses:  1. Lateral epicondylitis of left elbow   2. Chronic elbow pain, left    Plan: Impression is significantly improved left elbow pain from previous lateral epicondylopathy.  Responded very well to multiple shockwave treatments as well as nitroglycerin  patch protocol which she has continued. She has been very adherent to her home exercise regimen with both stretching and common extensor tendon strengthening.  She is not yet 100% improved, but markedly improved and only mild discomfort with a lot of computer typing or repetitive activity.  At this point, it has been near 12 weeks since beginning nitroglycerin  patch 0.2 mg/h, I would like her to continue through this week and then discontinue going forward.  Did discuss the importance of continuing both her stretching and her rehab exercises over the next 6-8 weeks and  then may transition to only as needed.  She will wear her counterforce strap only with excessive typing or repetitive gripping/upper extremity use such as gardening, weeding, etc. they use ibuprofen for pain only as needed, but nothing needed consistently. Given improvement, she will follow-up with me on an as-needed basis.  If for some reason pain returns, could consider 1 additional CSI, versus diagnostic ultrasound, ECSWT vs. Prolo inj  Follow-up: Return if symptoms worsen or fail to improve.   Meds & Orders: No orders of the defined types were placed in this encounter.  No orders of the defined types were placed in this encounter.    Procedures: No procedures performed      Clinical History: No specialty comments available.  She reports that she has never smoked. She has never used smokeless tobacco. No results for input(s): HGBA1C, LABURIC in the last 8760 hours.  Objective:    Physical Exam  Gen: Well-appearing, in no acute distress; non-toxic CV: Well-perfused. Warm.  Resp: Breathing unlabored on room air; no wheezing. Psych: Fluid speech in conversation; appropriate affect; normal thought process  Ortho Exam - Left elbow: There is very minimal TTP over the lateral epicondyle.  There is no redness swelling or effusion about the elbow.  Full range of motion with flexion and extension.  There is very mild pain with resisted pronation, no pain with resisted supination.  Negative Maudsley's test today.  Imaging: No results found.  Past Medical/Family/Surgical/Social History: Medications & Allergies reviewed per EMR, new medications updated. Patient Active Problem List  Diagnosis Date Noted   Eustachian tube dysfunction, bilateral 06/04/2021   Spondylosis without myelopathy or radiculopathy, lumbar region 02/16/2019   Spondylolisthesis of lumbar region 02/16/2019   Chronic left-sided low back pain with left-sided sciatica 07/01/2018   Right upper quadrant abdominal pain  09/20/2017   Routine general medical examination at a health care facility 01/30/2016   IUD (intrauterine device) in place 08/30/2015   Fatty liver disease, nonalcoholic 11/08/2014   Obesity 93/72/7983   Splenomegaly 11/08/2014   Menorrhagia with regular cycle 08/01/2014   Osteoarthritis 02/05/2008   NEPHROLITHIASIS, HX OF 02/05/2008   Past Medical History:  Diagnosis Date   Allergy    seasonal allergies   Ankle fracture    LEFT   Arthritis    bilateral knees   Hiatal hernia    IBS (irritable bowel syndrome)    Kidney calculi    kidney stone   Family History  Problem Relation Age of Onset   Breast cancer Paternal Aunt    Hypertension Father    Hypertension Paternal Grandfather    CVA Paternal Grandfather    Colon cancer Paternal Grandmother 104   Colon polyps Paternal Grandmother 48   Esophageal cancer Neg Hx    Rectal cancer Neg Hx    Stomach cancer Neg Hx    Past Surgical History:  Procedure Laterality Date   BACK SURGERY  11/2019   CESAREAN SECTION  2001, 2004   x2   CYSTOSCOPY/RETROGRADE/URETEROSCOPY/STONE EXTRACTION WITH BASKET  2001   WISDOM TOOTH EXTRACTION     Social History   Occupational History   Not on file  Tobacco Use   Smoking status: Never   Smokeless tobacco: Never  Vaping Use   Vaping status: Never Used  Substance and Sexual Activity   Alcohol use: Yes    Alcohol/week: 1.0 standard drink of alcohol    Types: 1 Standard drinks or equivalent per week   Drug use: No   Sexual activity: Yes    Partners: Male    Birth control/protection: Other-see comments, I.U.D.    Comment: vasectomy

## 2024-03-16 ENCOUNTER — Encounter: Payer: Self-pay | Admitting: Radiology

## 2024-06-11 ENCOUNTER — Ambulatory Visit (INDEPENDENT_AMBULATORY_CARE_PROVIDER_SITE_OTHER): Payer: Managed Care, Other (non HMO) | Admitting: Obstetrics & Gynecology

## 2024-06-11 VITALS — BP 141/84 | HR 91 | Ht 62.5 in | Wt 235.6 lb

## 2024-06-11 DIAGNOSIS — Z8659 Personal history of other mental and behavioral disorders: Secondary | ICD-10-CM

## 2024-06-11 DIAGNOSIS — Z1331 Encounter for screening for depression: Secondary | ICD-10-CM

## 2024-06-11 DIAGNOSIS — Z975 Presence of (intrauterine) contraceptive device: Secondary | ICD-10-CM | POA: Diagnosis not present

## 2024-06-11 DIAGNOSIS — Z01419 Encounter for gynecological examination (general) (routine) without abnormal findings: Secondary | ICD-10-CM | POA: Diagnosis not present

## 2024-06-11 DIAGNOSIS — R161 Splenomegaly, not elsewhere classified: Secondary | ICD-10-CM

## 2024-06-11 MED ORDER — PAROXETINE HCL 20 MG PO TABS: 20.0000 mg | ORAL_TABLET | Freq: Every day | ORAL | 4 refills | Status: AC

## 2024-06-11 NOTE — Progress Notes (Signed)
 "  ANNUAL EXAM Patient name: Dorothy Armstrong MRN [id number]  Date of birth: [birth year] Chief Complaint:   Gynecologic Exam  History of Present Illness:   BIJAL SIGLIN is a 54 y.o. G2P2 Caucasian female being seen today for a routine annual exam.  They went to [place] for Christmas.  Patient reports that she is scheduled for colonoscopy next month. She has been having stools that aren't as solid as she would like for them to be. Patient reports no other issues or concerns at this time.   Denies vaginal bleeding.  Has Mirena  IUD that was placed 2021.    No LMP recorded. (Menstrual status: IUD).   Last pap 01/24/2022. Results were: NILM w/ HRHPV negative. H/O abnormal pap: no Last mammogram: 07/24/2023. Results were: normal. Family h/o breast cancer: yes paternal aunt Last colonoscopy: 08/08/2020. Results were: abnormal five polyps. Family h/o colorectal cancer: yes paternal grandmother     06/11/2024    2:26 PM 06/10/2023    3:18 PM 01/24/2022    3:16 PM 11/09/2020    1:32 PM 05/20/2017    7:55 AM  Depression screen PHQ 2/9  Decreased Interest 0 0 0 0 0  Down, Depressed, Hopeless 0 0 1 0 0  PHQ - 2 Score 0 0 1 0 0    Review of Systems:   Pertinent items are noted in HPI Denies any urinary changes. Denies pelvic pain.   Pertinent History Reviewed:  Reviewed past medical,surgical, social and family history.  Reviewed problem list, medications and allergies. Physical Assessment:   Vitals:   06/11/24 1420  BP: (!) 141/84  Pulse: 91  SpO2: 97%  Weight: 235 lb 9.6 oz (106.9 kg)  Height: 5' 2.5 (1.588 m)  Body mass index is 42.41 kg/m.        Physical Examination:   General appearance - well appearing, and in no distress  Mental status - alert, oriented to person, place, and time  Psych:  She has a normal mood and affect  Skin - warm and dry, normal color, no suspicious lesions noted  Chest - effort normal, all lung fields clear to auscultation bilaterally  Heart - normal  rate and regular rhythm  Neck:  midline trachea, no thyromegaly or nodules  Breasts - breasts appear normal, no suspicious masses, no skin or nipple changes or  axillary nodes  Abdomen - soft, nontender, nondistended, no masses or organomegaly  Pelvic - VULVA: normal appearing vulva with no masses, tenderness, several sebaceous cysts noted   VAGINA: normal appearing vagina with normal color and discharge, no lesions   CERVIX: normal appearing cervix without discharge or lesions, no CMT, IUD string noted  Thin prep pap is not indicated  UTERUS: uterus is felt to be normal size, shape, consistency and nontender   ADNEXA: No adnexal masses or tenderness noted.  Rectal - deferred today due to upcoming colonoscopy  Extremities:  No swelling or varicosities noted  Chaperone present for exam  No results found for this or any previous visit (from the past 24 hours).  Assessment & Plan:  1. Well woman exam with routine gynecological exam (Primary) - Pap smear neg with neg HR HPV 01/24/2022 - Mammogram 07/24/2023 - Colonoscopy 08/08/2020 - Bone mineral density not indicated - lab work done with PCP, Dr. Rollene - vaccines reviewed/updated  2. IUD (intrauterine device) in place - will remove 2028 (will be 8 years of use then)  3. Splenomegaly - reviewed vaccines.  Pt needs meningococcus vaccination.  Information provided.  4. History of depression - PARoxetine  (PAXIL ) 20 MG tablet; Take 1 tablet (20 mg total) by mouth daily.  Dispense: 90 tablet; Refill: 4   No orders of the defined types were placed in this encounter.   Meds:  Meds ordered this encounter  Medications   PARoxetine  (PAXIL ) 20 MG tablet    Sig: Take 1 tablet (20 mg total) by mouth daily.    Dispense:  90 tablet    Refill:  4    Follow-up: Return in about 1 year (around 06/11/2025).  Ronal GORMAN Pinal, MD 06/13/2024 6:55 AM "

## 2024-06-13 ENCOUNTER — Encounter (HOSPITAL_BASED_OUTPATIENT_CLINIC_OR_DEPARTMENT_OTHER): Payer: Self-pay | Admitting: Obstetrics & Gynecology

## 2024-07-13 ENCOUNTER — Encounter

## 2024-07-27 ENCOUNTER — Encounter: Admitting: Gastroenterology
